# Patient Record
Sex: Male | Born: 1985 | Race: Black or African American | Hispanic: No | Marital: Single | Smoking: Current every day smoker
Health system: Southern US, Community
[De-identification: ages and names within clinical notes are randomized; demographics above are authoritative.]

## PROBLEM LIST (undated history)

## (undated) DIAGNOSIS — I10 Essential (primary) hypertension: Secondary | ICD-10-CM

## (undated) DIAGNOSIS — E119 Type 2 diabetes mellitus without complications: Secondary | ICD-10-CM

## (undated) HISTORY — PX: APPENDECTOMY: SHX54

## (undated) HISTORY — PX: TONSILLECTOMY: SUR1361

## (undated) HISTORY — PX: ADENOIDECTOMY: SUR15

---

## 2012-05-21 ENCOUNTER — Emergency Department (HOSPITAL_COMMUNITY)
Admission: EM | Admit: 2012-05-21 | Discharge: 2012-05-21 | Disposition: A | Discharge status: Home / Self Care | Payer: Self-pay | Attending: Emergency Medicine | Admitting: Emergency Medicine

## 2012-05-21 ENCOUNTER — Emergency Department (HOSPITAL_COMMUNITY): Payer: Self-pay

## 2012-05-21 ENCOUNTER — Encounter (HOSPITAL_COMMUNITY): Payer: Self-pay | Admitting: Emergency Medicine

## 2012-05-21 DIAGNOSIS — X58XXXA Exposure to other specified factors, initial encounter: Secondary | ICD-10-CM | POA: Insufficient documentation

## 2012-05-21 DIAGNOSIS — S62609A Fracture of unspecified phalanx of unspecified finger, initial encounter for closed fracture: Secondary | ICD-10-CM | POA: Insufficient documentation

## 2012-05-21 DIAGNOSIS — Y939 Activity, unspecified: Secondary | ICD-10-CM | POA: Insufficient documentation

## 2012-05-21 DIAGNOSIS — Y929 Unspecified place or not applicable: Secondary | ICD-10-CM | POA: Insufficient documentation

## 2012-05-21 MED ORDER — HYDROCODONE-ACETAMINOPHEN 5-325 MG PO TABS: 1.0000 | ORAL_TABLET | Freq: Four times a day (QID) | ORAL | Status: AC | PRN

## 2012-05-21 MED ORDER — IBUPROFEN 800 MG PO TABS
800.0000 mg | ORAL_TABLET | Freq: Once | ORAL | Status: AC
  Administered 2012-05-21: 800 mg via ORAL
  Filled 2012-05-21: qty 1

## 2012-05-21 MED ORDER — HYDROCODONE-ACETAMINOPHEN 5-325 MG PO TABS
1.0000 | ORAL_TABLET | Freq: Once | ORAL | Status: AC
  Administered 2012-05-21: 1 via ORAL
  Filled 2012-05-21: qty 1
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
## 2012-05-22 NOTE — ED Provider Notes (Signed)
Medical screening examination/treatment/procedure(s) were performed by non-physician practitioner and as supervising physician I was immediately available for consultation/collaboration.  Shelda Jakes, MD 05/22/12 [phone]

## 2013-03-12 ENCOUNTER — Emergency Department (HOSPITAL_COMMUNITY): Payer: Self-pay

## 2013-03-12 ENCOUNTER — Encounter (HOSPITAL_COMMUNITY): Payer: Self-pay | Admitting: Emergency Medicine

## 2013-03-12 ENCOUNTER — Emergency Department (HOSPITAL_COMMUNITY)
Admission: EM | Admit: 2013-03-12 | Discharge: 2013-03-12 | Disposition: A | Discharge status: Home / Self Care | Payer: Self-pay | Attending: Emergency Medicine | Admitting: Emergency Medicine

## 2013-03-12 DIAGNOSIS — R110 Nausea: Secondary | ICD-10-CM | POA: Insufficient documentation

## 2013-03-12 DIAGNOSIS — Z79899 Other long term (current) drug therapy: Secondary | ICD-10-CM | POA: Insufficient documentation

## 2013-03-12 DIAGNOSIS — R1013 Epigastric pain: Secondary | ICD-10-CM | POA: Insufficient documentation

## 2013-03-12 LAB — COMPREHENSIVE METABOLIC PANEL
ALT: 25 U/L (ref 0–53)
AST: 26 U/L (ref 0–37)
Alkaline Phosphatase: 95 U/L (ref 39–117)
CO2: 28 mEq/L (ref 19–32)
Calcium: 9.6 mg/dL (ref 8.4–10.5)
Chloride: 102 mEq/L (ref 96–112)
GFR calc Af Amer: >90 mL/min (ref >90)
GFR calc non Af Amer: >90 mL/min (ref >90)
Glucose, Bld: 91 mg/dL (ref 70–99)
Potassium: 4.2 mEq/L (ref 3.5–5.1)
Sodium: 138 mEq/L (ref 135–145)

## 2013-03-12 LAB — CBC WITH DIFFERENTIAL/PLATELET
Basophils Absolute: 0.1 10*3/uL (ref 0.0–0.1)
Lymphocytes Relative: 59 % — ABNORMAL HIGH (ref 12–46)
Lymphs Abs: 4.1 10*3/uL — ABNORMAL HIGH (ref 0.7–4.0)
MCV: 90.9 fL (ref 78.0–100.0)
Neutro Abs: 2 10*3/uL (ref 1.7–7.7)
Neutrophils Relative %: 29 % — ABNORMAL LOW (ref 43–77)
Platelets: 263 10*3/uL (ref 150–400)
RBC: 4.7 MIL/uL (ref 4.22–5.81)
RDW: 12.9 % (ref 11.5–15.5)
WBC: 6.9 10*3/uL (ref 4.0–10.5)

## 2013-03-12 LAB — URINALYSIS, ROUTINE W REFLEX MICROSCOPIC
Hgb urine dipstick: NEGATIVE
Leukocytes, UA: NEGATIVE
Nitrite: NEGATIVE
Protein, ur: NEGATIVE mg/dL
Specific Gravity, Urine: 1.02 (ref 1.005–1.030)
Urobilinogen, UA: 0.2 mg/dL (ref 0.0–1.0)

## 2013-03-12 MED ORDER — SODIUM CHLORIDE 0.9 % IV BOLUS (SEPSIS)
1000.0000 mL | Freq: Once | INTRAVENOUS | Status: AC
  Administered 2013-03-12: 1000 mL via INTRAVENOUS

## 2013-03-12 MED ORDER — IOHEXOL 300 MG/ML  SOLN
100.0000 mL | Freq: Once | INTRAMUSCULAR | Status: AC | PRN
  Administered 2013-03-12: 100 mL via INTRAVENOUS

## 2013-03-12 MED ORDER — FAMOTIDINE 20 MG PO TABS: 20.0000 mg | ORAL_TABLET | Freq: Two times a day (BID) | ORAL | Status: DC

## 2013-03-12 MED ORDER — ONDANSETRON HCL 4 MG/2ML IJ SOLN
4.0000 mg | Freq: Once | INTRAMUSCULAR | Status: AC
  Administered 2013-03-12: 4 mg via INTRAVENOUS
  Filled 2013-03-12: qty 2

## 2013-03-12 MED ORDER — SODIUM CHLORIDE 0.9 % IV SOLN: INTRAVENOUS | Status: DC

## 2013-03-12 MED ORDER — IOHEXOL 300 MG/ML  SOLN
50.0000 mL | Freq: Once | INTRAMUSCULAR | Status: AC | PRN
  Administered 2013-03-12: 50 mL via ORAL

## 2013-03-12 MED ORDER — PANTOPRAZOLE SODIUM 40 MG IV SOLR
40.0000 mg | Freq: Once | INTRAVENOUS | Status: AC
  Administered 2013-03-12: 40 mg via INTRAVENOUS
  Filled 2013-03-12: qty 40

## 2013-03-12 NOTE — ED Notes (Signed)
Patient reports using superglue daily to fix broken tooth x2 weeks.

## 2013-03-12 NOTE — ED Provider Notes (Signed)
CSN: [id number]     Arrival date & time 03/12/13  9528 History  This chart was scribed for Shelda Jakes, MD,  by Ashley Jacobs, ED Scribe. The patient was seen in room APA07/APA07 and the patient's care was started at 7:43.  irst MD Initiated Contact with Patient 03/12/13 [phone]     Chief Complaint  Patient presents with  . Abdominal Pain   (Consider location/radiation/quality/duration/timing/severity/associated sxs/prior Treatment) Patient is a 27 y.o. male presenting with abdominal pain.  Abdominal Pain Associated symptoms: nausea   Associated symptoms: no chills, no cough, no diarrhea, no dysuria, no fever, no shortness of breath and no vomiting    HPI Comments: Brandon Esparza is a 27 y.o. male who presents to the Emergency Department complaining of intermittent, sharp, epigastric abdominal pain with onset of 2 weeks PTA. Pt explains the pain as 0/10 presently and 10/10 at worse. He mentions experiencing pain while eating. He states his last episode the pain lasted for two hours and the longest episode being 6-7 hours.  Yesterday he reports having a "hungry feeling" and the pain occurred spontaneously.  PT states associated symptoms of black stool and nausea, . He denies back pain and vomiting. Pt denies prior similar episode.  History reviewed. No pertinent past medical history. Past Surgical History  Procedure Laterality Date  . Tonsillectomy    . Appendectomy    . Adenoidectomy     No family history on file. History  Substance Use Topics  . Smoking status: Never Smoker   . Smokeless tobacco: Never Used  . Alcohol Use: No    Review of Systems  Constitutional: Negative for fever and chills.  HENT: Negative for congestion and rhinorrhea.   Eyes: Negative for visual disturbance.  Respiratory: Negative for cough and shortness of breath.   Cardiovascular: Negative for leg swelling.  Gastrointestinal: Positive for nausea and abdominal pain. Negative for vomiting and  diarrhea.  Genitourinary: Negative for dysuria and flank pain.  Musculoskeletal: Negative for back pain.  Skin: Negative for rash.  Neurological: Negative for headaches.  Hematological: Does not bruise/bleed easily.  Psychiatric/Behavioral: Negative for confusion.  All other systems reviewed and are negative.    Allergies  Review of patient's allergies indicates no known allergies.  Home Medications   Current Outpatient Rx  Name  Route  Sig  Dispense  Refill  . famotidine (PEPCID) 20 MG tablet   Oral   Take 1 tablet (20 mg total) by mouth 2 (two) times daily.   30 tablet   2    BP 131/107  Pulse 60  Temp(Src) 97.7 F (36.5 C) (Oral)  Resp 14  Ht 6\' 3"  (1.905 m)  Wt 300 lb (136.079 kg)  BMI 37.50 kg/m2  SpO2 98% Physical Exam  Constitutional: He is oriented to person, place, and time. He appears well-developed and well-nourished. No distress.  HENT:  Head: Normocephalic and atraumatic.  Mouth/Throat: Oropharynx is clear and moist.  Eyes: Conjunctivae and EOM are normal. Pupils are equal, round, and reactive to light.  Neck: Normal range of motion.  Cardiovascular: Normal rate, regular rhythm and normal heart sounds.  Exam reveals no gallop and no friction rub.   No murmur heard. Pulmonary/Chest: Effort normal and breath sounds normal. No respiratory distress. He has no wheezes. He has no rales.  Abdominal: Soft. There is no tenderness. There is no rebound and no guarding.  Musculoskeletal: Normal range of motion. He exhibits no edema and no tenderness.  Neurological: He is alert and  oriented to person, place, and time. No cranial nerve deficit. Coordination normal.  Skin: Skin is warm. No rash noted. He is not diaphoretic.  Psychiatric: He has a normal mood and affect. His behavior is normal.    ED Course  Procedures (including critical care time) DIAGNOSTIC STUDIES: Oxygen Saturation is 98% on room air, normal by my interpretation.    COORDINATION OF  CARE: 7:50 AM Discussed course of care with pt . Pt understands and agrees.    Labs Review Labs Reviewed  CBC WITH DIFFERENTIAL - Abnormal; Notable for the following:    Neutrophils Relative % 29 (*)    Lymphocytes Relative 59 (*)    Lymphs Abs 4.1 (*)    All other components within normal limits  URINALYSIS, ROUTINE W REFLEX MICROSCOPIC  COMPREHENSIVE METABOLIC PANEL  LIPASE, BLOOD  OCCULT BLOOD, POC DEVICE   Results for orders placed during the hospital encounter of 03/12/13  URINALYSIS, ROUTINE W REFLEX MICROSCOPIC      Result Value Range   Color, Urine YELLOW  YELLOW   APPearance CLEAR  CLEAR   Specific Gravity, Urine 1.020  1.005 - 1.030   pH 6.5  5.0 - 8.0   Glucose, UA NEGATIVE  NEGATIVE mg/dL   Hgb urine dipstick NEGATIVE  NEGATIVE   Bilirubin Urine NEGATIVE  NEGATIVE   Ketones, ur NEGATIVE  NEGATIVE mg/dL   Protein, ur NEGATIVE  NEGATIVE mg/dL   Urobilinogen, UA 0.2  0.0 - 1.0 mg/dL   Nitrite NEGATIVE  NEGATIVE   Leukocytes, UA NEGATIVE  NEGATIVE  CBC WITH DIFFERENTIAL      Result Value Range   WBC 6.9  4.0 - 10.5 K/uL   RBC 4.70  4.22 - 5.81 MIL/uL   Hemoglobin 13.9  13.0 - 17.0 g/dL   HCT 13.2  44.0 - 10.2 %   MCV 90.9  78.0 - 100.0 fL   MCH 29.6  26.0 - 34.0 pg   MCHC 32.6  30.0 - 36.0 g/dL   RDW 72.5  36.6 - 44.0 %   Platelets 263  150 - 400 K/uL   Neutrophils Relative % 29 (*) 43 - 77 %   Neutro Abs 2.0  1.7 - 7.7 K/uL   Lymphocytes Relative 59 (*) 12 - 46 %   Lymphs Abs 4.1 (*) 0.7 - 4.0 K/uL   Monocytes Relative 7  3 - 12 %   Monocytes Absolute 0.5  0.1 - 1.0 K/uL   Eosinophils Relative 4  0 - 5 %   Eosinophils Absolute 0.3  0.0 - 0.7 K/uL   Basophils Relative 1  0 - 1 %   Basophils Absolute 0.1  0.0 - 0.1 K/uL  COMPREHENSIVE METABOLIC PANEL      Result Value Range   Sodium 138  135 - 145 mEq/L   Potassium 4.2  3.5 - 5.1 mEq/L   Chloride 102  96 - 112 mEq/L   CO2 28  19 - 32 mEq/L   Glucose, Bld 91  70 - 99 mg/dL   BUN 6  6 - 23 mg/dL    Creatinine, Ser 3.47  0.50 - 1.35 mg/dL   Calcium 9.6  8.4 - 42.5 mg/dL   Total Protein 8.0  6.0 - 8.3 g/dL   Albumin 3.7  3.5 - 5.2 g/dL   AST 26  0 - 37 U/L   ALT 25  0 - 53 U/L   Alkaline Phosphatase 95  39 - 117 U/L   Total Bilirubin 0.3  0.3 - 1.2 mg/dL   GFR calc non Af Amer >90  >90 mL/min   GFR calc Af Amer >90  >90 mL/min  LIPASE, BLOOD      Result Value Range   Lipase 45  11 - 59 U/L  OCCULT BLOOD, POC DEVICE      Result Value Range   Fecal Occult Bld NEGATIVE  NEGATIVE    Imaging Review Dg Chest 2 View  03/12/2013   CLINICAL DATA:  Abdominal pain for 2 weeks, smoking history  EXAM: CHEST  2 VIEW  COMPARISON:  None.  FINDINGS: The lungs are slightly hyperaerated. No focal infiltrate or effusion is seen. Mediastinal contours are normal. The heart is within normal limits in size.  IMPRESSION: No active cardiopulmonary disease.   Electronically Signed   By: Dwyane Dee M.D.   On: 03/12/2013 08:54   Ct Abdomen Pelvis W Contrast  03/12/2013   CLINICAL DATA:  Intermittent sharp epigastric abdominal pain onset 2 weeks ago, worsening, pain with eating, associated nausea and black stool, no back pain or vomiting ; past history appendectomy  EXAM: CT ABDOMEN AND PELVIS WITH CONTRAST  TECHNIQUE: Multidetector CT imaging of the abdomen and pelvis was performed using the standard protocol following bolus administration of intravenous contrast. Sagittal and coronal MPR images reconstructed from axial data set.  CONTRAST:  50mL OMNIPAQUE IOHEXOL 300 MG/ML SOLN, OMNIPAQUE IOHEXOL 300 MG/ML SOLN  COMPARISON:  None  FINDINGS: Lung bases clear.  Mild degradation of image quality secondary to body habitus.  Liver, spleen, pancreas, kidneys, and adrenal glands normal appearance.  Stomach normally distended without wall thickening or outlet obstruction.  Large and small bowel loops suboptimally opacified but otherwise normal appearance.  Normal appearing bladder in ureters.  Mildly enlarged inguinal  lymph nodes bilaterally largest right inguinal 19 mm short axis image 92.  Enlarged right external iliac lymph node 20 mm short axis image 84.  Numerous additional normal and upper normal sized bilateral inguinal lymph nodes.  No mass, free fluid, inflammatory process or hernia.  No acute osseous findings.  IMPRESSION: Right external iliac and right inguinal adenopathy with numerous additional normal and upper normal sized inguinal lymph nodes bilaterally, nonspecific.  No definite acute intra-abdominal or intrapelvic abnormalities identified.   Electronically Signed   By: Ulyses Southward M.D.   On: 03/12/2013 09:25    EKG Interpretation   None       MDM   1. Epigastric abdominal pain    Workup for the epigastric abdominal pain without specific findings. No evidence of gallbladder problems no evidence of pancreatitis. No evidence of a lower lobe pneumonia. Patient's Hemoccult stool is negative so no evidence of GI bleeding. Patient did in retrospect Stark Klein did take some Pepto-Bismol so the black stools are probably related to that. Patient will be treated for the possibility of peptic ulcer disease he was started on Pepcid for the next 2 weeks given GI referral and given a resource guide to help find a primary care Dr. Patient will return for new or worse symptoms.   I personally performed the services described in this documentation, which was scribed in my presence. The recorded information has been reviewed and is accurate.      Shelda Jakes, MD 03/12/13 [phone]

## 2013-03-12 NOTE — ED Notes (Addendum)
Patient c/o intermittent lower abd pain x2 weeks. Denies any nausea, vomiting, fevers, or diarrhea. Patient also denies any urinary symptoms. Patient reports taking Pepto Bismol with no relief. Patient reports last BM yesterday. Per patient dark tarry stool. Patient reports pain with eating starting yesterday.

## 2014-10-02 IMAGING — CR DG CHEST 2V
2 series · 2 of 2 positions shown · non-contrast
Comparison: None.

CLINICAL DATA: Abdominal pain for 2 weeks, smoking history

EXAM:
CHEST  2 VIEW

[view not recorded (1 of 2)]
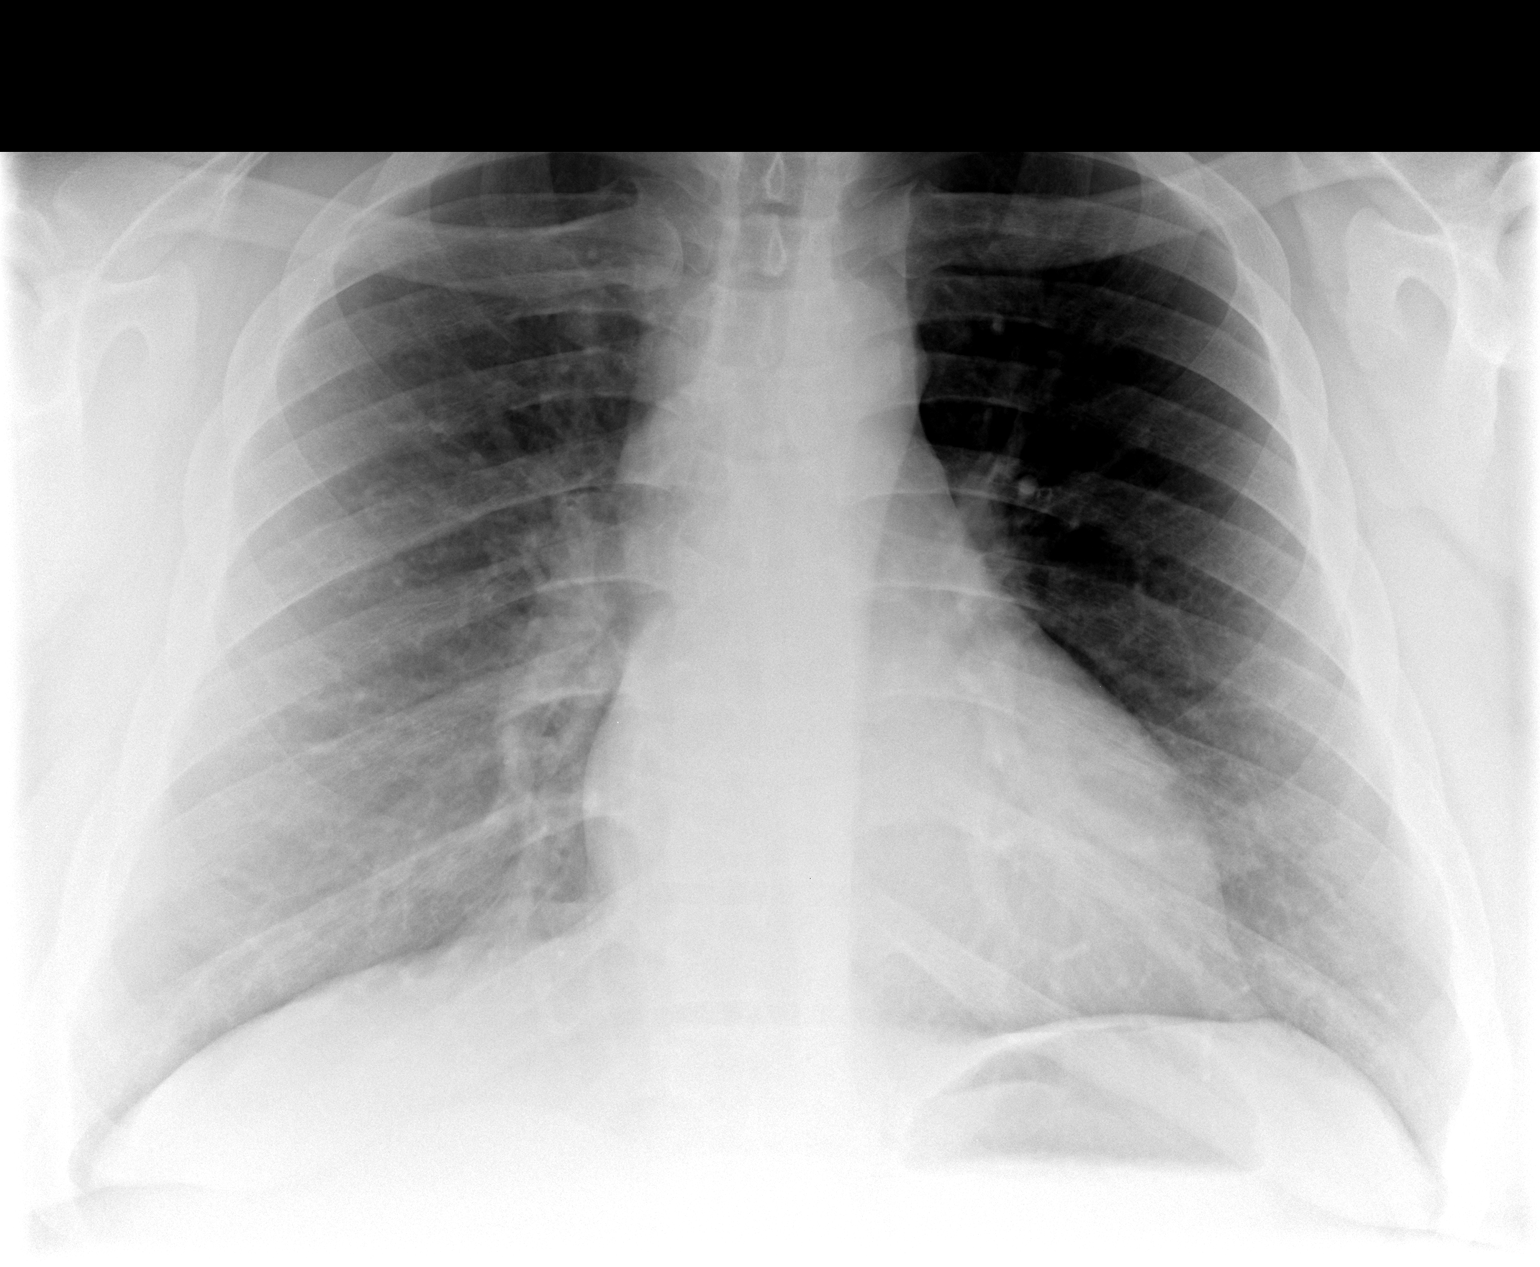

[view not recorded (2 of 2)]
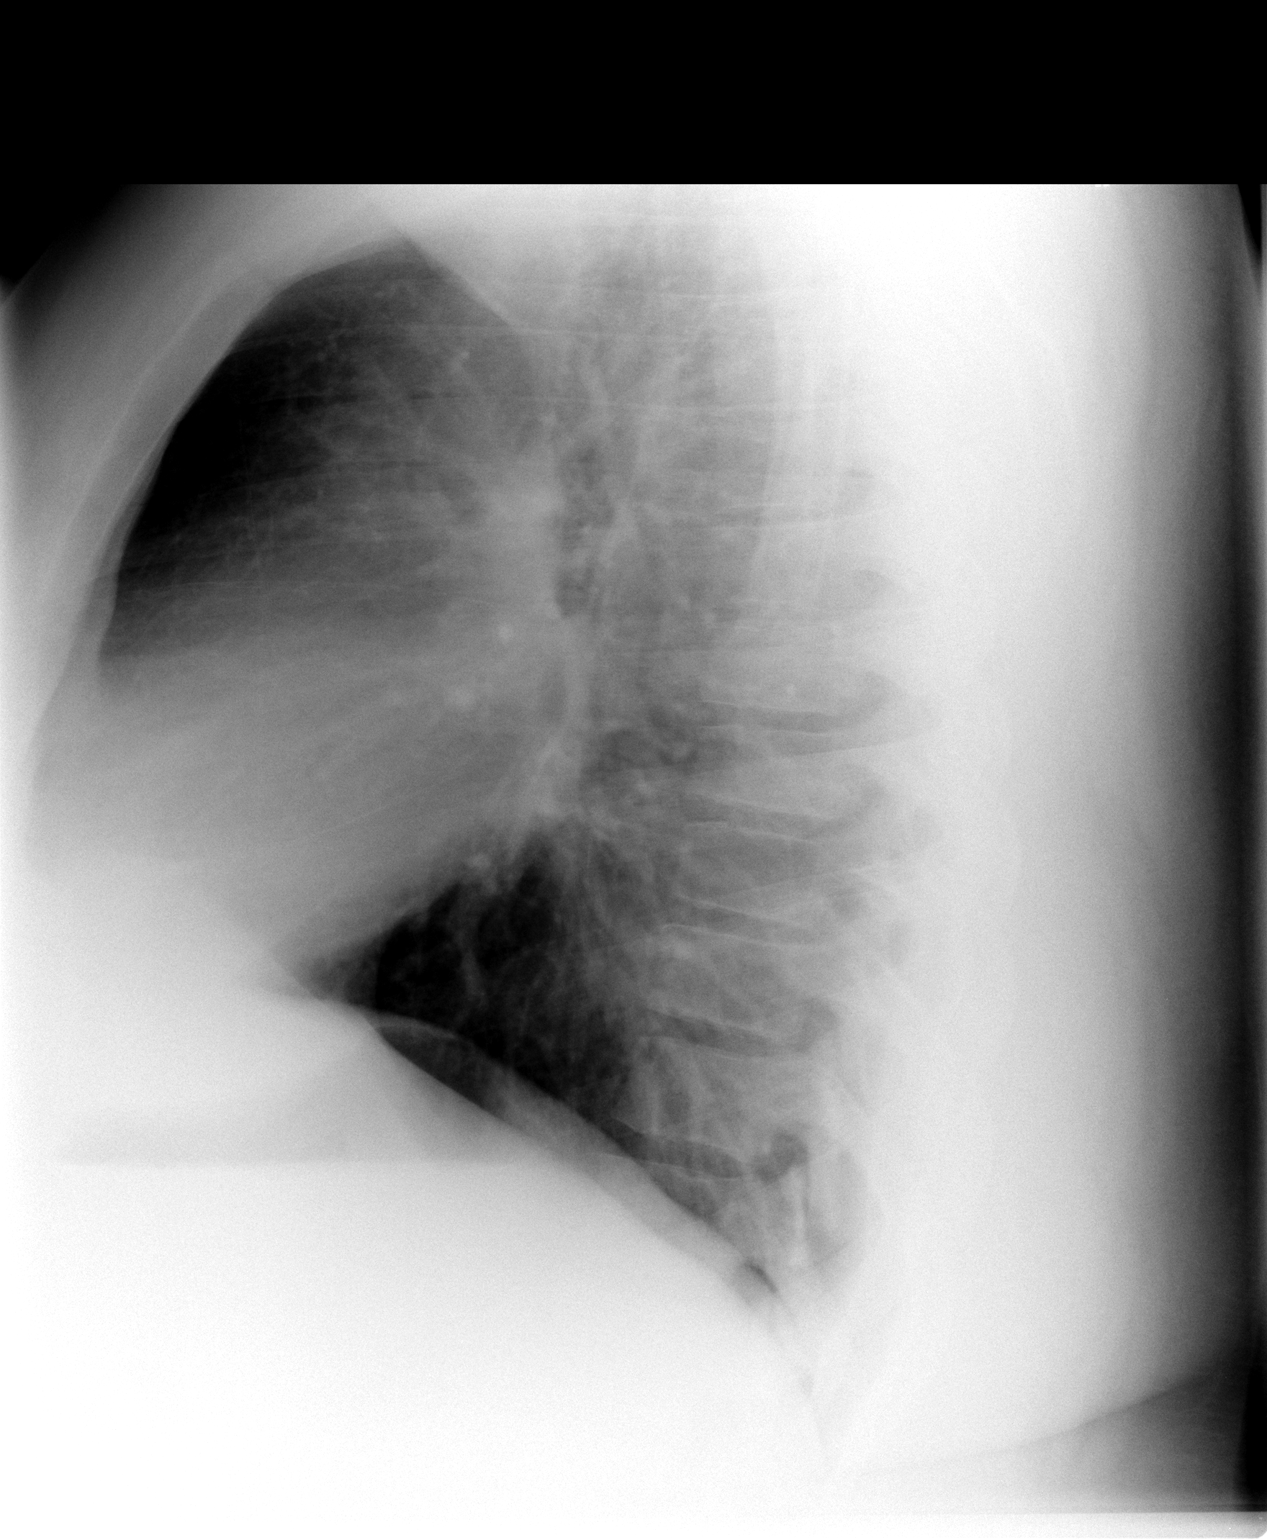

[2 of 2 positions shown; findings below may reference images not displayed]

FINDINGS: The lungs are slightly hyperaerated. No focal infiltrate or effusion
is seen. Mediastinal contours are normal. The heart is within normal
limits in size.
IMPRESSION: No active cardiopulmonary disease.

## 2014-10-02 IMAGING — CT CT ABD-PELV W/ CM
2 of 3 series · 17 of 46 positions shown, 19 images · IV contrast (Omnipaque 300)
Comparison: None

CLINICAL DATA: Intermittent sharp epigastric abdominal pain onset 2
weeks ago, worsening, pain with eating, associated nausea and black
stool, no back pain or vomiting ; past history appendectomy

EXAM:
CT ABDOMEN AND PELVIS WITH CONTRAST
TECHNIQUE: Multidetector CT imaging of the abdomen and pelvis was performed
using the standard protocol following bolus administration of
intravenous contrast. Sagittal and coronal MPR images reconstructed
from axial data set.
CONTRAST:  50mL OMNIPAQUE IOHEXOL 300 MG/ML SOLN, 100mL OMNIPAQUE
IOHEXOL 300 MG/ML SOLN

[Series 2: abd_pel_with 5.0 b40f · axial · 0.91mm/px · z∈[-487,-52]mm · 14 of 101 slices shown, 16 images]
[im 7/101  soft-tissue]
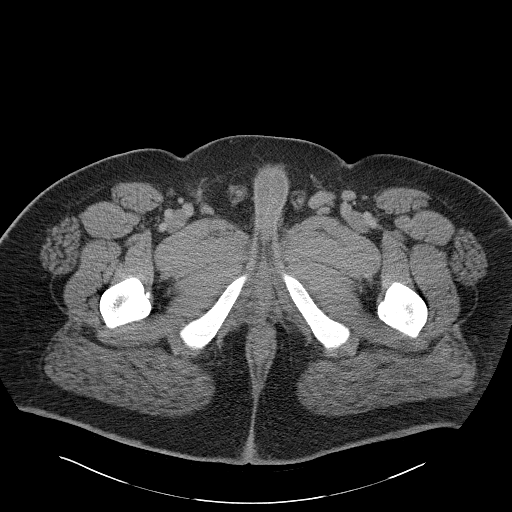
[im 7/101  bone]
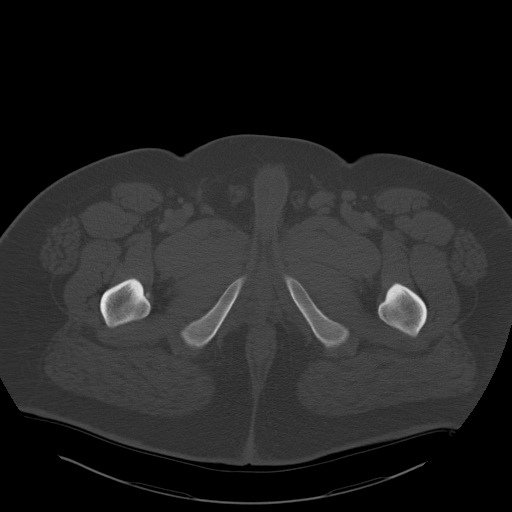
[im 13/101  soft-tissue]
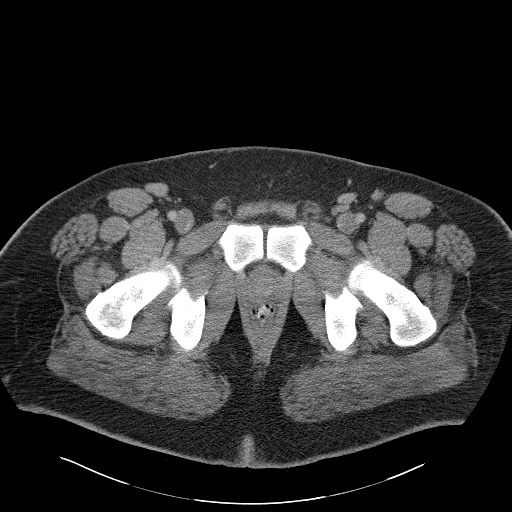
[im 20/101  soft-tissue]
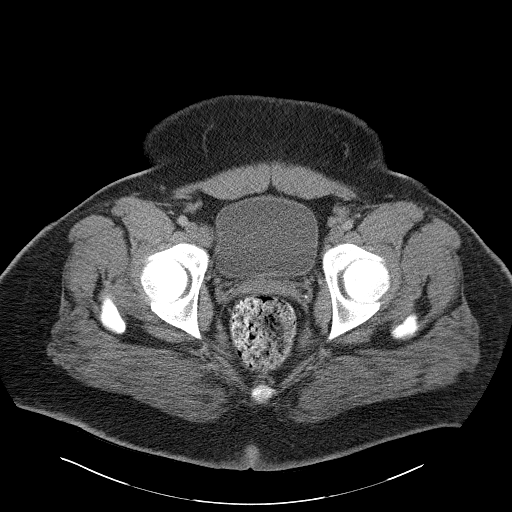
[im 26/101  soft-tissue]
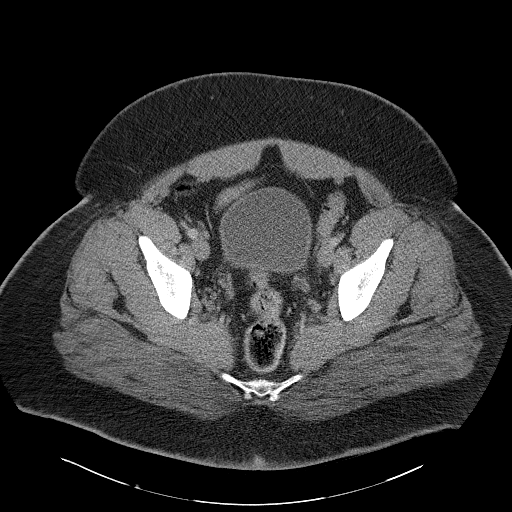
[im 33/101  soft-tissue]
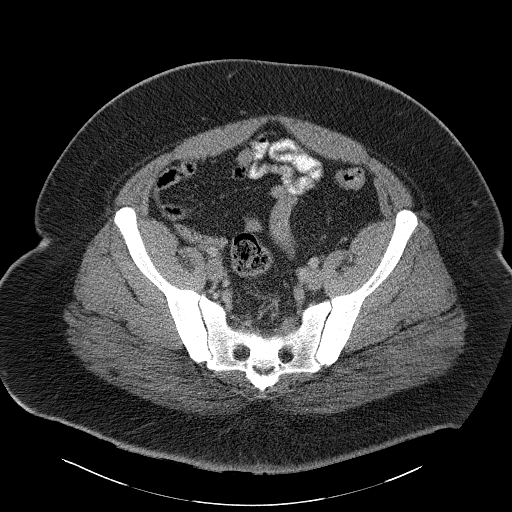
[im 39/101  soft-tissue]
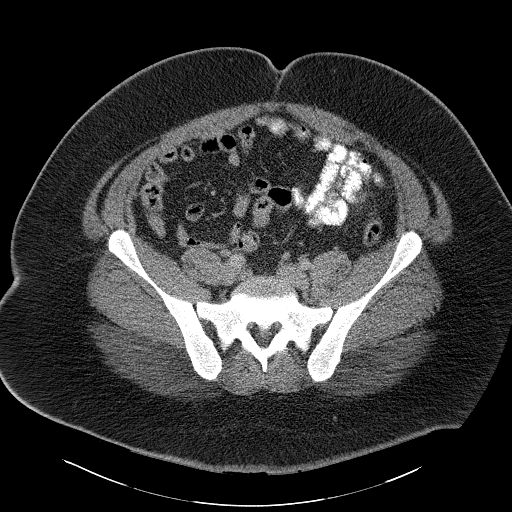
[im 46/101  soft-tissue]
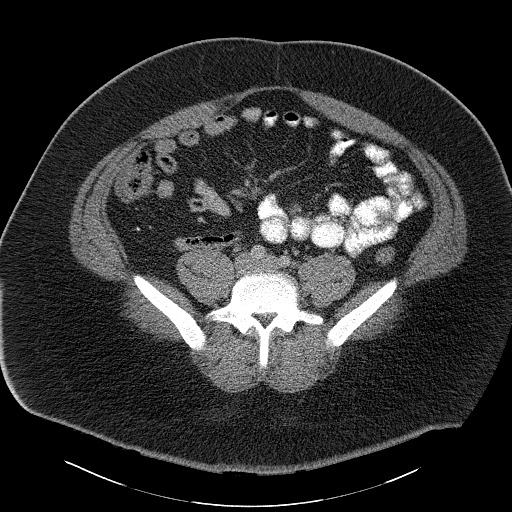
[im 55/101  soft-tissue]
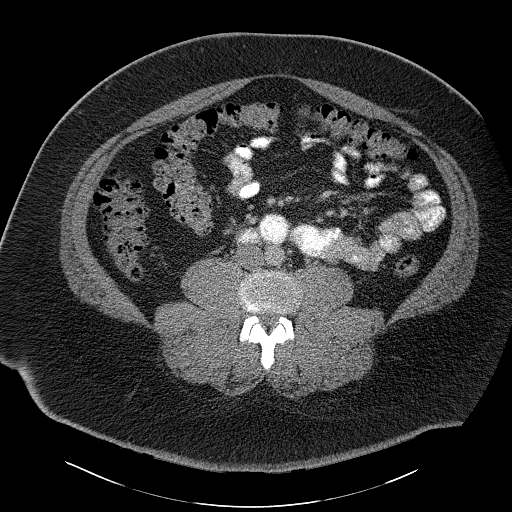
[im 62/101  soft-tissue]
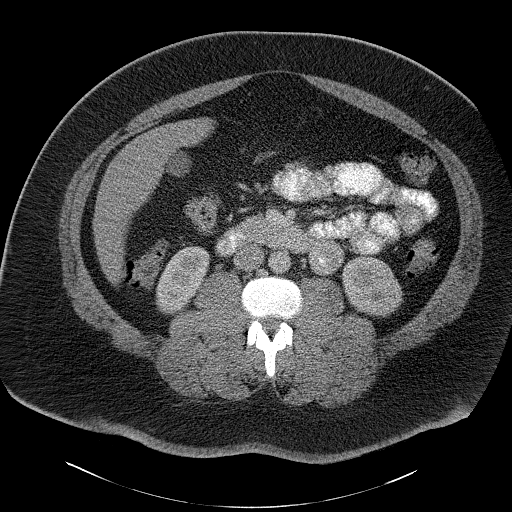
[im 62/101  bone]
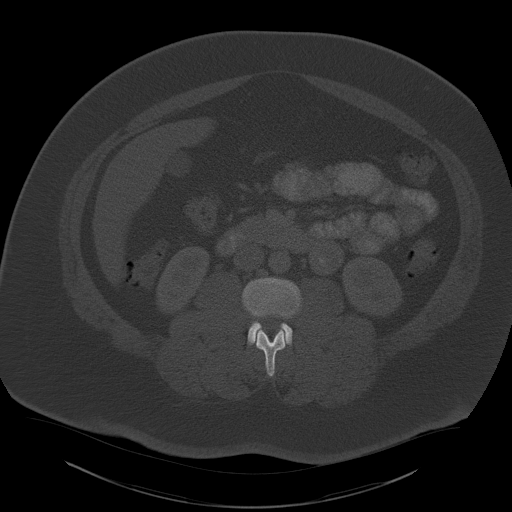
[im 68/101  soft-tissue]
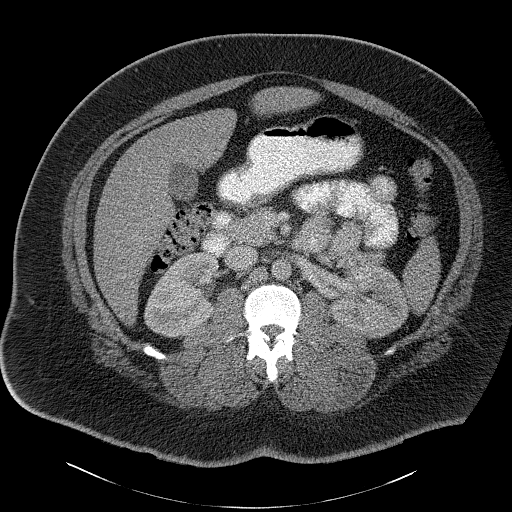
[im 75/101  soft-tissue]
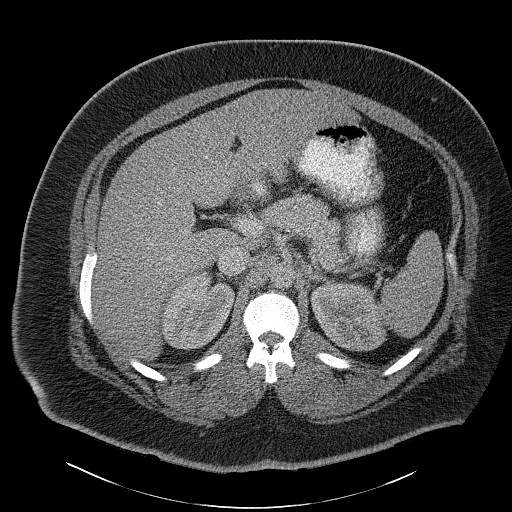
[im 81/101  soft-tissue]
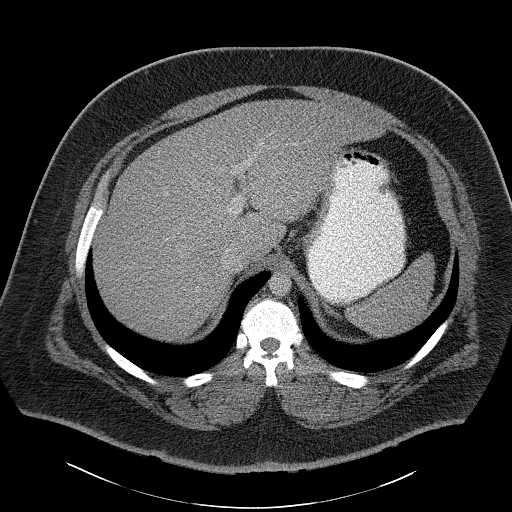
[im 88/101  soft-tissue]
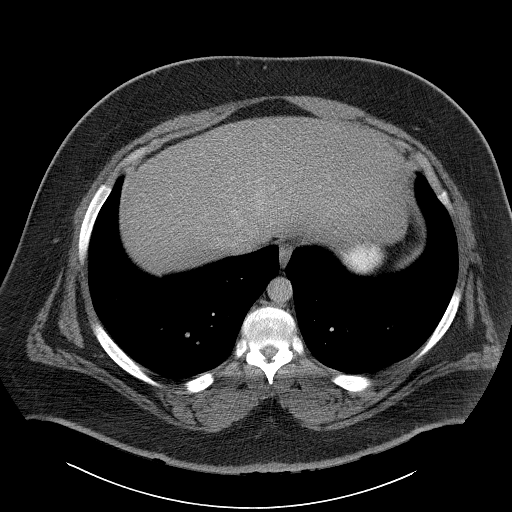
[im 94/101  soft-tissue]
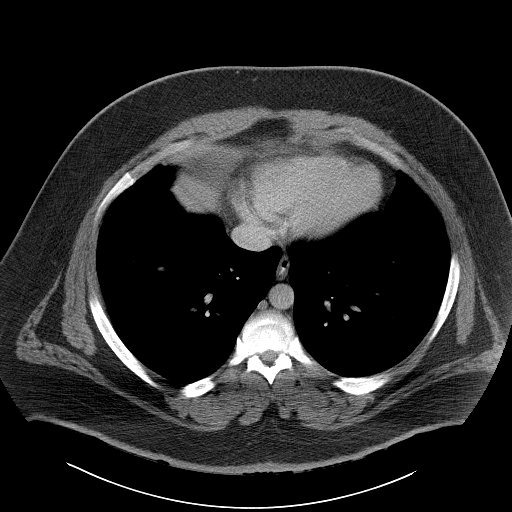

[Series 4: abd_pel_with 3.0 spo cor · coronal · 0.94mm/px · 3 of 116 slices shown]
[im 39/116  soft-tissue]
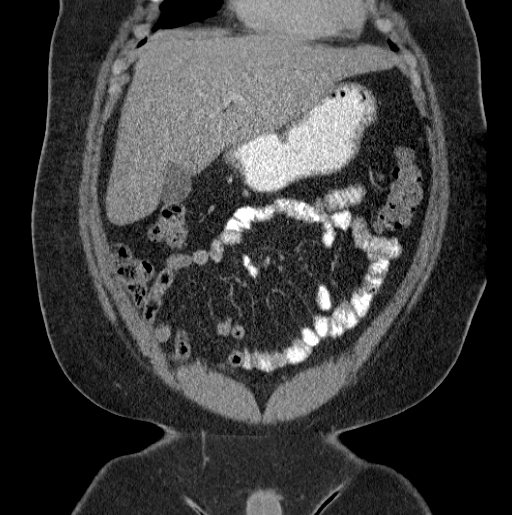
[im 52/116  soft-tissue]
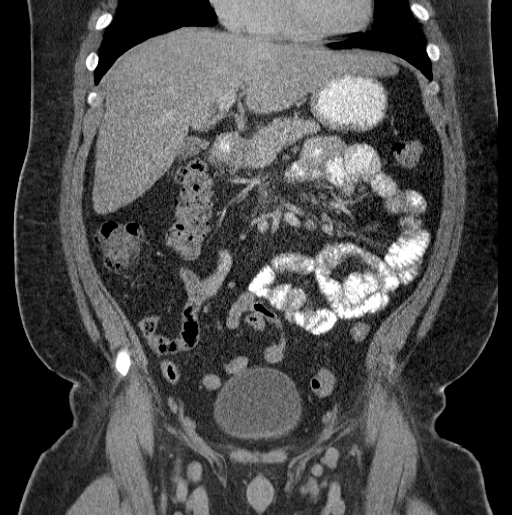
[im 64/116  soft-tissue]
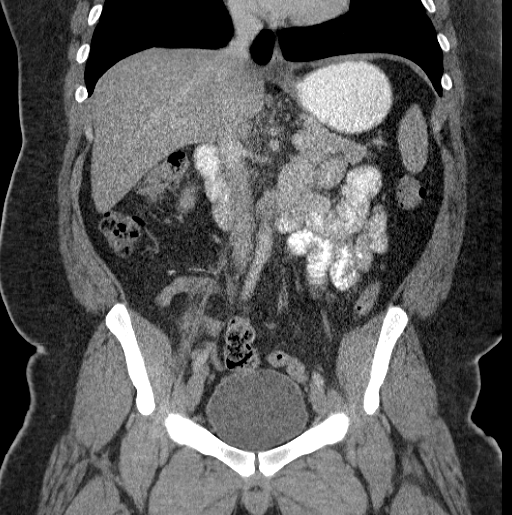

[17 of 46 positions shown; findings below may reference images not displayed]

FINDINGS: Lung bases clear.

Mild degradation of image quality secondary to body habitus.

Liver, spleen, pancreas, kidneys, and adrenal glands normal
appearance.

Stomach normally distended without wall thickening or outlet
obstruction.

Large and small bowel loops suboptimally opacified but otherwise
normal appearance.

Normal appearing bladder in ureters.

Mildly enlarged inguinal lymph nodes bilaterally largest right
inguinal 19 mm short axis image 92.

Enlarged right external iliac lymph node 20 mm short axis image 84.

Numerous additional normal and upper normal sized bilateral inguinal
lymph nodes.

No mass, free fluid, inflammatory process or hernia.

No acute osseous findings.
IMPRESSION: Right external iliac and right inguinal adenopathy with numerous
additional normal and upper normal sized inguinal lymph nodes
bilaterally, nonspecific.

No definite acute intra-abdominal or intrapelvic abnormalities
identified.

## 2014-11-21 ENCOUNTER — Emergency Department (HOSPITAL_COMMUNITY)
Admission: EM | Admit: 2014-11-21 | Discharge: 2014-11-21 | Disposition: A | Discharge status: Home / Self Care | Payer: Self-pay | Attending: Emergency Medicine | Admitting: Emergency Medicine

## 2014-11-21 ENCOUNTER — Emergency Department (HOSPITAL_COMMUNITY): Payer: Self-pay

## 2014-11-21 ENCOUNTER — Encounter (HOSPITAL_COMMUNITY): Payer: Self-pay | Admitting: Emergency Medicine

## 2014-11-21 DIAGNOSIS — E119 Type 2 diabetes mellitus without complications: Secondary | ICD-10-CM | POA: Insufficient documentation

## 2014-11-21 DIAGNOSIS — Y999 Unspecified external cause status: Secondary | ICD-10-CM | POA: Insufficient documentation

## 2014-11-21 DIAGNOSIS — X58XXXA Exposure to other specified factors, initial encounter: Secondary | ICD-10-CM | POA: Insufficient documentation

## 2014-11-21 DIAGNOSIS — Y939 Activity, unspecified: Secondary | ICD-10-CM | POA: Insufficient documentation

## 2014-11-21 DIAGNOSIS — Z720 Tobacco use: Secondary | ICD-10-CM | POA: Insufficient documentation

## 2014-11-21 DIAGNOSIS — Z79899 Other long term (current) drug therapy: Secondary | ICD-10-CM | POA: Insufficient documentation

## 2014-11-21 DIAGNOSIS — Y929 Unspecified place or not applicable: Secondary | ICD-10-CM | POA: Insufficient documentation

## 2014-11-21 DIAGNOSIS — I10 Essential (primary) hypertension: Secondary | ICD-10-CM | POA: Insufficient documentation

## 2014-11-21 DIAGNOSIS — B353 Tinea pedis: Secondary | ICD-10-CM | POA: Insufficient documentation

## 2014-11-21 HISTORY — DX: Type 2 diabetes mellitus without complications: E11.9

## 2014-11-21 HISTORY — DX: Essential (primary) hypertension: I10

## 2014-11-21 LAB — CBG MONITORING, ED: Glucose-Capillary: 91 mg/dL (ref 65–99)

## 2014-11-21 MED ORDER — CLOTRIMAZOLE 1 % EX CREA: TOPICAL_CREAM | CUTANEOUS | Status: DC

## 2014-11-21 NOTE — ED Provider Notes (Signed)
CSN: [id number]     Arrival date & time 11/21/14  1237 History  This chart was scribed for Brandon Rhine, MD by Lyndel Safe, ED Scribe. This patient was seen in room APA09/APA09 and the patient's care was started 12:51 PM.    Chief Complaint  Patient presents with  . Foot Injury    Patient is a 29 y.o. male presenting with foot injury. The history is provided by the patient and a significant other. No language interpreter was used.  Foot Injury Location:  Foot Injury: yes   Mechanism of injury comment:  Pt unsure Foot location:  L foot Pain details:    Radiates to:  Does not radiate   Severity:  Mild   Timing:  Intermittent   Progression:  Unchanged Chronicity:  New Dislocation: no   Foreign body present:  No foreign bodies Relieved by:  None tried Worsened by:  Nothing tried Ineffective treatments:  None tried Associated symptoms: no back pain, no fever and no numbness    HPI Comments: Brandon Esparza is a 29 y.o. male, with a PMhx of DM and HTN, who presents to the Emergency Department complaining of a laceration that has not healed to left foot in between his 2nd and 3rd toes with intermittent, mild pain. Pt reports he sustained the laceration over a month ago and is unaware of the mechanism of injury. His significant other reports he is diabetic and she is concerned about the laceration on the pt's foot after she noticed a black discharge oozing from the wound. He denies cough, fevers, vomiting, nausea, CP, back pain, abdominal pain, any other arthralgias or myalgias, or numbness in LE/feet.   Past Medical History  Diagnosis Date  . Diabetes mellitus without complication   . Hypertension    Past Surgical History  Procedure Laterality Date  . Tonsillectomy    . Appendectomy    . Adenoidectomy     History reviewed. No pertinent family history. History  Substance Use Topics  . Smoking status: Current Every Day Smoker -- 0.50 packs/day    Types: Cigarettes  .  Smokeless tobacco: Never Used  . Alcohol Use: No    Review of Systems  Constitutional: Negative for fever.  Respiratory: Negative for cough.   Cardiovascular: Negative for chest pain.  Gastrointestinal: Negative for nausea, vomiting and abdominal pain.  Musculoskeletal: Negative for myalgias, back pain and arthralgias.  Skin: Positive for wound.  Neurological: Negative for numbness.   Allergies  Review of patient's allergies indicates no known allergies.  Home Medications   Prior to Admission medications   Medication Sig Start Date End Date Taking? Authorizing Provider  famotidine (PEPCID) 20 MG tablet Take 1 tablet (20 mg total) by mouth 2 (two) times daily. 03/12/13   Vanetta Mulders, MD   BP 162/105 mmHg  Pulse 80  Temp(Src) 98 F (36.7 C) (Oral)  Resp 18  Ht  (1.905 m)  Wt 350 lb (158.759 kg)  BMI 43.75 kg/m2  SpO2 100% Physical Exam  CONSTITUTIONAL: Well developed/well nourished HEAD: Normocephalic/atraumatic EYES: EOMI ENMT: Mucous membranes moist NECK: supple no meningeal signs SPINE/BACK:entire spine nontender CV: S1/S2 noted, no murmurs/rubs/gallops noted LUNGS: Lungs are clear to auscultation bilaterally, no apparent distress ABDOMEN: soft, nontender, no rebound or guarding, bowel sounds noted throughout abdomen NEURO: Pt is awake/alert/appropriate, moves all extremitiesx4.  No facial droop.   EXTREMITIES: pulses normal/equal, full ROM; no puncture wounds; no erythema; no tenderness to left foot; small area of maceration in between toes; no  drainage noted  SKIN: warm, color normal PSYCH: no abnormalities of mood noted, alert and oriented to situation  ED Course  Procedures  DIAGNOSTIC STUDIES: Oxygen Saturation is 100% on RA, normal by my interpretation.    COORDINATION OF CARE: 12:56 PM Discussed treatment plan which includes to order diagnostic labs and imaging with pt. Pt acknowledges and agrees to plan.    Suspect tinea pedis No signs of  diabetic foot or deep space infection/osteomyelitis Stable for d/c home  Labs Review Labs Reviewed  CBG MONITORING, ED    Imaging Review Dg Foot Complete Left  11/21/2014   CLINICAL DATA:  Left foot infection  EXAM: LEFT FOOT - COMPLETE 3+ VIEW  COMPARISON:  None.  FINDINGS: No fracture or dislocation is seen.  The joint spaces are preserved.  Visualized soft tissues are grossly unremarkable.  No radiopaque foreign body is seen.  No radiographic findings to suggest osteomyelitis.  IMPRESSION: No radiographic findings to suggest osteomyelitis.   Electronically Signed   By: Charline BillsSriyesh  Krishnan M.D.   On: 11/21/2014 13:21     MDM   Final diagnoses:  Tinea pedis of left foot    Nursing notes including past medical history and social history reviewed and considered in documentation xrays/imaging reviewed by myself and considered during evaluation Labs/vital reviewed myself and considered during evaluation    I personally performed the services described in this documentation, which was scribed in my presence. The recorded information has been reviewed and is accurate.      Brandon Rhineonald Adanely Reynoso, MD 11/21/14 [phone]

## 2014-11-21 NOTE — Discharge Instructions (Signed)
Athlete's Foot  Athlete's foot is a skin infection caused by a fungus. Athlete's foot is often seen between or under the toes. It can also be seen on the bottom of the foot. Athlete's foot can spread to other people by sharing towels or shower stalls. HOME CARE  Only take medicines as told by your doctor. Do not use steroid creams.  Wash your feet daily. Dry your feet well, especially between the toes.  Change your socks every day. Wear cotton or wool socks.  Change your socks 2 to 3 times a day in hot weather.  Wear sandals or canvas tennis shoes with good airflow.  If you have blisters, soak your feet in a solution as told by your doctor. Do this for 20 to 30 minutes, 2 times a day. Dry your feet well after you soak them.  Do not share towels.  Wear sandals when you use shared locker rooms or showers. GET HELP RIGHT AWAY IF:   You have a fever.  Your foot is puffy (swollen), sore, warm, or red.  You are not getting better after 7 days of treatment.  You still have athlete's foot after 30 days.  You have problems caused by your medicine. MAKE SURE YOU:   Understand these instructions.  Will watch your condition.  Will get help right away if you are not doing well or get worse. Document Released: 10/10/2007 Document Revised: 07/16/2011 Document Reviewed: 02/09/2011 [employer] Patient Information 2015 [employer]. This information is not intended to replace advice given to you by your health care provider. Make sure you discuss any questions you have with your health care provider.  

## 2014-11-21 NOTE — ED Notes (Signed)
Pt states that he has had a cut between his 2nd and 3rd toes on left foot.  States that it has been there for a while and is not getting better.

## 2015-03-09 ENCOUNTER — Emergency Department (HOSPITAL_COMMUNITY)
Admission: EM | Admit: 2015-03-09 | Discharge: 2015-03-09 | Disposition: A | Discharge status: Home / Self Care | Payer: Self-pay | Attending: Emergency Medicine | Admitting: Emergency Medicine

## 2015-03-09 ENCOUNTER — Encounter (HOSPITAL_COMMUNITY): Payer: Self-pay

## 2015-03-09 DIAGNOSIS — Z79899 Other long term (current) drug therapy: Secondary | ICD-10-CM | POA: Insufficient documentation

## 2015-03-09 DIAGNOSIS — I10 Essential (primary) hypertension: Secondary | ICD-10-CM | POA: Insufficient documentation

## 2015-03-09 DIAGNOSIS — E119 Type 2 diabetes mellitus without complications: Secondary | ICD-10-CM | POA: Insufficient documentation

## 2015-03-09 DIAGNOSIS — B353 Tinea pedis: Secondary | ICD-10-CM | POA: Insufficient documentation

## 2015-03-09 DIAGNOSIS — L03032 Cellulitis of left toe: Secondary | ICD-10-CM | POA: Insufficient documentation

## 2015-03-09 DIAGNOSIS — Z720 Tobacco use: Secondary | ICD-10-CM | POA: Insufficient documentation

## 2015-03-09 LAB — CBG MONITORING, ED: GLUCOSE-CAPILLARY: 81 mg/dL (ref 65–99)

## 2015-03-09 MED ORDER — TRAMADOL HCL 50 MG PO TABS: 50.0000 mg | ORAL_TABLET | Freq: Four times a day (QID) | ORAL | Status: DC | PRN

## 2015-03-09 MED ORDER — CLOTRIMAZOLE 1 % EX CREA: TOPICAL_CREAM | CUTANEOUS | Status: DC

## 2015-03-09 MED ORDER — SULFAMETHOXAZOLE-TRIMETHOPRIM 800-160 MG PO TABS: 1.0000 | ORAL_TABLET | Freq: Two times a day (BID) | ORAL | Status: AC

## 2015-03-09 NOTE — ED Notes (Signed)
Pt states understanding of care given and follow up instructions 

## 2015-03-09 NOTE — ED Provider Notes (Signed)
CSN: [id number]     Arrival date & time 03/09/15  2306 History  By signing my name below, I, Tanda Rockers, attest that this documentation has been prepared under the direction and in the presence of Gilda Crease, MD. Electronically Signed: Tanda Rockers, ED Scribe. 03/09/2015. 11:19 PM.  Chief Complaint  Patient presents with  . Toe Pain   The history is provided by the patient. No language interpreter was used.     HPI Comments: Brandon Esparza is a 29 y.o. male with hx DM who presents to the Emergency Department complaining of gradual onset, constant, sharp, 8/10, 5th digit pain on left foot that began this morning. Pt states that he wore shoes that were too small for him for a couple days prior to the pain beginning. No recent injury that could have caused the pain. Denies weakness, numbness, tingling, or any other associated symptoms.    Past Medical History  Diagnosis Date  . Diabetes mellitus without complication (HCC)   . Hypertension    Past Surgical History  Procedure Laterality Date  . Tonsillectomy    . Appendectomy    . Adenoidectomy     No family history on file. Social History  Substance Use Topics  . Smoking status: Current Every Day Smoker -- 0.50 packs/day    Types: Cigarettes  . Smokeless tobacco: Never Used  . Alcohol Use: No    Review of Systems  Musculoskeletal: Positive for arthralgias.  Neurological: Negative for weakness and numbness.  Psychiatric/Behavioral: Agitation: 5th digit on left foot.  All other systems reviewed and are negative.  Allergies  Review of patient's allergies indicates no known allergies.  Home Medications   Prior to Admission medications   Medication Sig Start Date End Date Taking? Authorizing Provider  lisinopril-hydrochlorothiazide (PRINZIDE,ZESTORETIC) 20-12.5 MG per tablet Take 1 tablet by mouth daily.   Yes Historical Provider, MD  metFORMIN (GLUCOPHAGE) 500 MG tablet Take 500 mg by mouth 2 (two) times daily  with a meal.   Yes Historical Provider, MD  clotrimazole (LOTRIMIN) 1 % cream Apply to affected area 2 times daily 03/09/15   Gilda Crease, MD  sulfamethoxazole-trimethoprim (BACTRIM DS,SEPTRA DS) 800-160 MG tablet Take 1 tablet by mouth 2 (two) times daily. 03/09/15 03/16/15  Gilda Crease, MD  traMADol (ULTRAM) 50 MG tablet Take 1 tablet (50 mg total) by mouth every 6 (six) hours as needed. 03/09/15   Gilda Crease, MD   Triage Vitals: BP 140/102 mmHg  Pulse 88  Temp(Src) 97.8 F (36.6 C) (Oral)  Resp 20  Ht  (1.905 m)  SpO2 98%   Physical Exam  Constitutional: He is oriented to person, place, and time. He appears well-developed and well-nourished. No distress.  HENT:  Head: Normocephalic and atraumatic.  Right Ear: Hearing normal.  Left Ear: Hearing normal.  Nose: Nose normal.  Mouth/Throat: Oropharynx is clear and moist and mucous membranes are normal.  Eyes: Conjunctivae and EOM are normal. Pupils are equal, round, and reactive to light.  Neck: Normal range of motion. Neck supple.  Cardiovascular: Regular rhythm, S1 normal and S2 normal.  Exam reveals no gallop and no friction rub.   No murmur heard. Pulmonary/Chest: Effort normal and breath sounds normal. No respiratory distress. He exhibits no tenderness.  Abdominal: Soft. Normal appearance and bowel sounds are normal. There is no hepatosplenomegaly. There is no tenderness. There is no rebound, no guarding, no tenderness at McBurney's point and negative Murphy's sign. No hernia.  Musculoskeletal: Normal  range of motion.  Neurological: He is alert and oriented to person, place, and time. He has normal strength. No cranial nerve deficit or sensory deficit. Coordination normal. GCS eye subscore is 4. GCS verbal subscore is 5. GCS motor subscore is 6.  Skin: Skin is warm, dry and intact. No cyanosis.  Slightly erythematous left pink toe with swelling and tenderness  No drainage No fluctuance Diffuse  scaling between the web spaces of all the toes  Psychiatric: He has a normal mood and affect. His speech is normal and behavior is normal. Thought content normal.  Nursing note and vitals reviewed.   ED Course  Procedures (including critical care time)  DIAGNOSTIC STUDIES: Oxygen Saturation is 98% on RA, normal by my interpretation.    COORDINATION OF CARE: 11:16 PM-Discussed treatment plan which includes CBG with pt at bedside and pt agreed to plan.   Labs Review Labs Reviewed  CBG MONITORING, ED    Imaging Review No results found..   EKG Interpretation None      MDM   Final diagnoses:  Athlete's foot on left  Cellulitis of toe of left foot   Complains of pain in his left small toe after wearing shoes that were too tight. Patient does have evidence of tinea pedis of both feet. There is, however, tenderness and slight erythema of the left small toe. There is no drainage. Suspect very early cellulitis. Patient is a diabetic, blood sugar is normal. Will treat topically for tinea pedis and had antibiotic coverage in the form of Bactrim.      Gilda Creasehristopher J Pollina, MD 03/10/15 [phone]

## 2015-03-09 NOTE — ED Notes (Signed)
Pt states his shoes are too tight and have been squeezing his feet and now he is having pain.  Calloused 5th toe of left foot

## 2015-03-09 NOTE — Discharge Instructions (Signed)
Athlete's Foot Athlete's foot (tinea pedis) is a fungal infection of the skin on the feet. It often occurs on the skin between the toes or underneath the toes. It can also occur on the soles of the feet. Athlete's foot is more likely to occur in hot, humid weather. Not washing your feet or changing your socks often enough can contribute to athlete's foot. The infection can spread from person to person (contagious). CAUSES Athlete's foot is caused by a fungus. This fungus thrives in warm, moist places. Most people get athlete's foot by sharing shower stalls, towels, and wet floors with an infected person. People with weakened immune systems, including those with diabetes, may be more likely to get athlete's foot. SYMPTOMS   Itchy areas between the toes or on the soles of the feet.  White, flaky, or scaly areas between the toes or on the soles of the feet.  Tiny, intensely itchy blisters between the toes or on the soles of the feet.  Tiny cuts on the skin. These cuts can develop a bacterial infection.  Thick or discolored toenails. DIAGNOSIS  Your caregiver can usually tell what the problem is by doing a physical exam. Your caregiver may also take a skin sample from the rash area. The skin sample may be examined under a microscope, or it may be tested to see if fungus will grow in the sample. A sample may also be taken from your toenail for testing. TREATMENT  Over-the-counter and prescription medicines can be used to kill the fungus. These medicines are available as powders or creams. Your caregiver can suggest medicines for you. Fungal infections respond slowly to treatment. You may need to continue using your medicine for several weeks. PREVENTION   Do not share towels.  Wear sandals in wet areas, such as shared locker rooms and shared showers.  Keep your feet dry. Wear shoes that allow air to circulate. Wear cotton or wool socks. HOME CARE INSTRUCTIONS   Take medicines as directed by  your caregiver. Do not use steroid creams on athlete's foot.  Keep your feet clean and cool. Wash your feet daily and dry them thoroughly, especially between your toes.  Change your socks every day. Wear cotton or wool socks. In hot climates, you may need to change your socks 2 to 3 times per day.  Wear sandals or canvas tennis shoes with good air circulation.  If you have blisters, soak your feet in Burow's solution or Epsom salts for 20 to 30 minutes, 2 times a day to dry out the blisters. Make sure you dry your feet thoroughly afterward. SEEK MEDICAL CARE IF:   You have a fever.  You have swelling, soreness, warmth, or redness in your foot.  You are not getting better after 7 days of treatment.  You are not completely cured after 30 days.  You have any problems caused by your medicines. MAKE SURE YOU:   Understand these instructions.  Will watch your condition.  Will get help right away if you are not doing well or get worse.   This information is not intended to replace advice given to you by your health care provider. Make sure you discuss any questions you have with your health care provider.   Document Released: 04/20/2000 Document Revised: 07/16/2011 Document Reviewed: 10/25/2014 Elsevier Interactive Patient Education 2016 Else[employer] Inc[employer]  Cellulitis Cellulitis is an infection of the skin and the tissue beneath it. The infected area is usually red and tender. Cellulitis occurs most often in  the arms and lower legs.  CAUSES  Cellulitis is caused by bacteria that enter the skin through cracks or cuts in the skin. The most common types of bacteria that cause cellulitis are staphylococci and streptococci. SIGNS AND SYMPTOMS   Redness and warmth.  Swelling.  Tenderness or pain.  Fever. DIAGNOSIS  Your health care provider can usually determine what is wrong based on a physical exam. Blood tests may also be done. TREATMENT  Treatment usually involves taking an  antibiotic medicine. HOME CARE INSTRUCTIONS   Take your antibiotic medicine as directed by your health care provider. Finish the antibiotic even if you start to feel better.  Keep the infected arm or leg elevated to reduce swelling.  Apply a warm cloth to the affected area up to 4 times per day to relieve pain.  Take medicines only as directed by your health care provider.  Keep all follow-up visits as directed by your health care provider. SEEK MEDICAL CARE IF:   You notice red streaks coming from the infected area.  Your red area gets larger or turns dark in color.  Your bone or joint underneath the infected area becomes painful after the skin has healed.  Your infection returns in the same area or another area.  You notice a swollen bump in the infected area.  You develop new symptoms.  You have a fever. SEEK IMMEDIATE MEDICAL CARE IF:   You feel very sleepy.  You develop vomiting or diarrhea.  You have a general ill feeling (malaise) with muscle aches and pains.   This information is not intended to replace advice given to you by your health care provider. Make sure you discuss any questions you have with your health care provider.   Document Released: 01/31/2005 Document Revised: 01/12/2015 Document Reviewed: 07/09/2011 Elsevier Interactive Patient Education [employer] Inc[employer]

## 2015-03-09 NOTE — ED Notes (Signed)
Pt states he wore shoes that didn't really fit him and he wore them for 2 days.  Pt states his left pinky toe is hurting but denies injury

## 2015-03-10 ENCOUNTER — Emergency Department (HOSPITAL_COMMUNITY)
Admission: EM | Admit: 2015-03-10 | Discharge: 2015-03-10 | Disposition: A | Discharge status: Home / Self Care | Payer: Self-pay | Attending: Emergency Medicine | Admitting: Emergency Medicine

## 2015-03-10 ENCOUNTER — Encounter (HOSPITAL_COMMUNITY): Payer: Self-pay

## 2015-03-10 ENCOUNTER — Emergency Department (HOSPITAL_COMMUNITY): Payer: Self-pay

## 2015-03-10 DIAGNOSIS — B353 Tinea pedis: Secondary | ICD-10-CM | POA: Insufficient documentation

## 2015-03-10 DIAGNOSIS — E119 Type 2 diabetes mellitus without complications: Secondary | ICD-10-CM | POA: Insufficient documentation

## 2015-03-10 DIAGNOSIS — L02612 Cutaneous abscess of left foot: Secondary | ICD-10-CM | POA: Insufficient documentation

## 2015-03-10 DIAGNOSIS — Z79899 Other long term (current) drug therapy: Secondary | ICD-10-CM | POA: Insufficient documentation

## 2015-03-10 DIAGNOSIS — Z792 Long term (current) use of antibiotics: Secondary | ICD-10-CM | POA: Insufficient documentation

## 2015-03-10 DIAGNOSIS — I10 Essential (primary) hypertension: Secondary | ICD-10-CM | POA: Insufficient documentation

## 2015-03-10 DIAGNOSIS — Z720 Tobacco use: Secondary | ICD-10-CM | POA: Insufficient documentation

## 2015-03-10 MED ORDER — BUPIVACAINE HCL (PF) 0.5 % IJ SOLN
10.0000 mL | Freq: Once | INTRAMUSCULAR | Status: AC
Start: 2015-03-10 — End: 2015-03-10
  Administered 2015-03-10: 10 mL
  Filled 2015-03-10: qty 30

## 2015-03-10 MED ORDER — BACITRACIN ZINC 500 UNIT/GM EX OINT
TOPICAL_OINTMENT | CUTANEOUS | Status: AC
  Filled 2015-03-10: qty 0.9

## 2015-03-10 MED ORDER — OXYCODONE-ACETAMINOPHEN 5-325 MG PO TABS
1.0000 | ORAL_TABLET | Freq: Once | ORAL | Status: AC
  Administered 2015-03-10: 1 via ORAL
  Filled 2015-03-10: qty 1

## 2015-03-10 MED ORDER — HYDROCODONE-ACETAMINOPHEN 5-325 MG PO TABS: 2.0000 | ORAL_TABLET | ORAL | Status: DC | PRN

## 2015-03-10 NOTE — Discharge Instructions (Signed)
Abscess An abscess is an infected area that contains a collection of pus and debris.It can occur in almost any part of the body. An abscess is also known as a furuncle or boil. CAUSES  An abscess occurs when tissue gets infected. This can occur from blockage of oil or sweat glands, infection of hair follicles, or a minor injury to the skin. As the body tries to fight the infection, pus collects in the area and creates pressure under the skin. This pressure causes pain. People with weakened immune systems have difficulty fighting infections and get certain abscesses more often.  SYMPTOMS Usually an abscess develops on the skin and becomes a painful mass that is red, warm, and tender. If the abscess forms under the skin, you may feel a moveable soft area under the skin. Some abscesses break open (rupture) on their own, but most will continue to get worse without care. The infection can spread deeper into the body and eventually into the bloodstream, causing you to feel ill.  DIAGNOSIS  Your caregiver will take your medical history and perform a physical exam. A sample of fluid may also be taken from the abscess to determine what is causing your infection. TREATMENT  Your caregiver may prescribe antibiotic medicines to fight the infection. However, taking antibiotics alone usually does not cure an abscess. Your caregiver may need to make a small cut (incision) in the abscess to drain the pus. In some cases, gauze is packed into the abscess to reduce pain and to continue draining the area. HOME CARE INSTRUCTIONS   Only take over-the-counter or prescription medicines for pain, discomfort, or fever as directed by your caregiver.  If you were prescribed antibiotics, take them as directed. Finish them even if you start to feel better.  If gauze is used, follow your caregiver's directions for changing the gauze.  To avoid spreading the infection:  Keep your draining abscess covered with a  bandage.  Wash your hands well.  Do not share personal care items, towels, or whirlpools with others.  Avoid skin contact with others.  Keep your skin and clothes clean around the abscess.  Keep all follow-up appointments as directed by your caregiver. SEEK MEDICAL CARE IF:   You have increased pain, swelling, redness, fluid drainage, or bleeding.  You have muscle aches, chills, or a general ill feeling.  You have a fever. MAKE SURE YOU:   Understand these instructions.  Will watch your condition.  Will get help right away if you are not doing well or get worse.   This information is not intended to replace advice given to you by your health care provider. Make sure you discuss any questions you have with your health care provider.   Document Released: 01/31/2005 Document Revised: 10/23/2011 Document Reviewed: 07/06/2011 Elsevier Interactive Patient Education 2016 Elsevier Inc. Diabetes and Foot Care Diabetes may cause you to have problems because of poor blood supply (circulation) to your feet and legs. This may cause the skin on your feet to become thinner, break easier, and heal more slowly. Your skin may become dry, and the skin may peel and crack. You may also have nerve damage in your legs and feet causing decreased feeling in them. You may not notice minor injuries to your feet that could lead to infections or more serious problems. Taking care of your feet is one of the most important things you can do for yourself.  HOME CARE INSTRUCTIONS  Wear shoes at all times, even in the  house. Do not go barefoot. Bare feet are easily injured.  Check your feet daily for blisters, cuts, and redness. If you cannot see the bottom of your feet, use a mirror or ask someone for help.  Wash your feet with warm water (do not use hot water) and mild soap. Then pat your feet and the areas between your toes until they are completely dry. Do not soak your feet as this can dry your  skin.  Apply a moisturizing lotion or petroleum jelly (that does not contain alcohol and is unscented) to the skin on your feet and to dry, brittle toenails. Do not apply lotion between your toes.  Trim your toenails straight across. Do not dig under them or around the cuticle. File the edges of your nails with an emery board or nail file.  Do not cut corns or calluses or try to remove them with medicine.  Wear clean socks or stockings every day. Make sure they are not too tight. Do not wear knee-high stockings since they may decrease blood flow to your legs.  Wear shoes that fit properly and have enough cushioning. To break in new shoes, wear them for just a few hours a day. This prevents you from injuring your feet. Always look in your shoes before you put them on to be sure there are no objects inside.  Do not cross your legs. This may decrease the blood flow to your feet.  If you find a minor scrape, cut, or break in the skin on your feet, keep it and the skin around it clean and dry. These areas may be cleansed with mild soap and water. Do not cleanse the area with peroxide, alcohol, or iodine.  When you remove an adhesive bandage, be sure not to damage the skin around it.  If you have a wound, look at it several times a day to make sure it is healing.  Do not use heating pads or hot water bottles. They may burn your skin. If you have lost feeling in your feet or legs, you may not know it is happening until it is too late.  Make sure your health care provider performs a complete foot exam at least annually or more often if you have foot problems. Report any cuts, sores, or bruises to your health care provider immediately. SEEK MEDICAL CARE IF:   You have an injury that is not healing.  You have cuts or breaks in the skin.  You have an ingrown nail.  You notice redness on your legs or feet.  You feel burning or tingling in your legs or feet.  You have pain or cramps in your  legs and feet.  Your legs or feet are numb.  Your feet always feel cold. SEEK IMMEDIATE MEDICAL CARE IF:   There is increasing redness, swelling, or pain in or around a wound.  There is a red line that goes up your leg.  Pus is coming from a wound.  You develop a fever or as directed by your health care provider.  You notice a bad smell coming from an ulcer or wound.   This information is not intended to replace advice given to you by your health care provider. Make sure you discuss any questions you have with your health care provider.   Document Released: 04/20/2000 Document Revised: 12/24/2012 Document Reviewed: 09/30/2012 Elsevier Interactive Patient Education [employer]

## 2015-03-10 NOTE — ED Notes (Signed)
   03/10/15 0312  Musculoskeletal  Musculoskeletal (WDL) X  LLE Full movement;Other (Comment) (Pt c/o left pinky toe pain and denies any injury. )

## 2015-03-10 NOTE — ED Notes (Signed)
Pt here earlier for left pinky toe pain, states pain is worse since he was here earlier

## 2015-03-10 NOTE — ED Provider Notes (Signed)
CSN: [id number]     Arrival date & time 03/10/15  0241 History   First MD Initiated Contact with Patient 03/10/15 0301     Chief Complaint  Patient presents with  . Toe Pain     (Consider location/radiation/quality/duration/timing/severity/associated sxs/prior Treatment) HPI Comments: Patient returns to the ER with continued complaints of left pinky toe pain. Patient was seen earlier tonight with similar complaints. Patient did have mild swelling of the toe with slight erythema, was initiated on antibiotics. He also has evidence of tinea pedis, was prescribed Lotrimin. Patient reports that since he left the hospital, however, the pain is becoming more severe.  Patient is a 29 y.o. male presenting with toe pain.  Toe Pain    Past Medical History  Diagnosis Date  . Diabetes mellitus without complication (HCC)   . Hypertension    Past Surgical History  Procedure Laterality Date  . Tonsillectomy    . Appendectomy    . Adenoidectomy     No family history on file. Social History  Substance Use Topics  . Smoking status: Current Every Day Smoker -- 0.50 packs/day    Types: Cigarettes  . Smokeless tobacco: Never Used  . Alcohol Use: No    Review of Systems  Musculoskeletal: Positive for arthralgias.  All other systems reviewed and are negative.     Allergies  Review of patient's allergies indicates no known allergies.  Home Medications   Prior to Admission medications   Medication Sig Start Date End Date Taking? Authorizing Provider  clotrimazole (LOTRIMIN) 1 % cream Apply to affected area 2 times daily 03/09/15   Gilda Crease, MD  lisinopril-hydrochlorothiazide (PRINZIDE,ZESTORETIC) 20-12.5 MG per tablet Take 1 tablet by mouth daily.    Historical Provider, MD  metFORMIN (GLUCOPHAGE) 500 MG tablet Take 500 mg by mouth 2 (two) times daily with a meal.    Historical Provider, MD  sulfamethoxazole-trimethoprim (BACTRIM DS,SEPTRA DS) 800-160 MG tablet Take 1 tablet  by mouth 2 (two) times daily. 03/09/15 03/16/15  Gilda Crease, MD  traMADol (ULTRAM) 50 MG tablet Take 1 tablet (50 mg total) by mouth every 6 (six) hours as needed. 03/09/15   Gilda Crease, MD   BP 150/96 mmHg  Pulse 79  Temp(Src) 97.9 F (36.6 C) (Oral)  Resp 22  SpO2 98% Physical Exam  Constitutional: He is oriented to person, place, and time. He appears well-developed and well-nourished. No distress.  HENT:  Head: Normocephalic and atraumatic.  Right Ear: Hearing normal.  Left Ear: Hearing normal.  Nose: Nose normal.  Mouth/Throat: Oropharynx is clear and moist and mucous membranes are normal.  Eyes: Conjunctivae and EOM are normal. Pupils are equal, round, and reactive to light.  Neck: Normal range of motion. Neck supple.  Cardiovascular: Regular rhythm, S1 normal and S2 normal.  Exam reveals no gallop and no friction rub.   No murmur heard. Pulmonary/Chest: Effort normal and breath sounds normal. No respiratory distress. He exhibits no tenderness.  Abdominal: Soft. Normal appearance and bowel sounds are normal. There is no hepatosplenomegaly. There is no tenderness. There is no rebound, no guarding, no tenderness at McBurney's point and negative Murphy's sign. No hernia.  Musculoskeletal: Normal range of motion.  Severe tenderness of left pinky toe with very slight swelling. No drainage. No fluctuance. No induration.  Neurological: He is alert and oriented to person, place, and time. He has normal strength. No cranial nerve deficit or sensory deficit. Coordination normal. GCS eye subscore is 4. GCS verbal subscore  is 5. GCS motor subscore is 6.  Skin: Skin is warm, dry and intact. No rash noted. No cyanosis.  Diffuse patchy thickening of skin and webspaces between all toes  Psychiatric: He has a normal mood and affect. His speech is normal and behavior is normal. Thought content normal.  Nursing note and vitals reviewed.   ED Course  Procedures (including  critical care time)  Procedure: Digital Block Area prepped with betadine and sterile towels applied. Landmarks identified. A 27-guage 1 1/4 inch needle was advanced dorsally, adjacent to the base of the proximal phalynx. Aspiration revealed no blood return. 3ml of 0.5% bupivacaine was injected. The steps were repeated on the other side of the phalynx. Patient tolerated the procedure well and there were no complications.  Procedure: Sharp debridement left small toe Area was prepped with chlorhexidine and sterile drapes. A 0.75 cm diameter area of callus was excised with a 15 blade. There was underlying blister fluid as well as pus. This was fully cleaned and the area was sterilely dressed. Patient tolerated the procedure well and there were no complications.  Labs Review Labs Reviewed - No data to display  Imaging Review No results found. I have personally reviewed and evaluated these images and lab results as part of my medical decision-making.   EKG Interpretation None      MDM   Final diagnoses:  None   blister  Skin abscess  Patient returns to the ER for evaluation of continued toe pain. Pain seems out of proportion to the exam. I recommended digital block so I could further evaluate. Patient did consent to the center block was performed with bupivacaine. It was noted that he had a very thick callus over tip of the toe. I recommended unroofing this. This was performed and there was a moderate amount of blister fluid and also a moderate amount of pus underneath the callus consistent with infected blister in small abscess formation. I recommended the Bactrim that was prescribed at previous visit and will also be given additional pain medicine. Patient is to follow-up with a podiatrist as soon as possible. Return to the ER for worsening symptoms.    Gilda Creasehristopher J Ndia Sampath, MD 03/10/15 0400

## 2015-03-28 ENCOUNTER — Emergency Department (HOSPITAL_COMMUNITY): Payer: Self-pay

## 2015-03-28 ENCOUNTER — Encounter (HOSPITAL_COMMUNITY): Payer: Self-pay

## 2015-03-28 ENCOUNTER — Emergency Department (HOSPITAL_COMMUNITY)
Admission: EM | Admit: 2015-03-28 | Discharge: 2015-03-28 | Disposition: A | Discharge status: Home / Self Care | Payer: Self-pay | Attending: Emergency Medicine | Admitting: Emergency Medicine

## 2015-03-28 DIAGNOSIS — Z7984 Long term (current) use of oral hypoglycemic drugs: Secondary | ICD-10-CM | POA: Insufficient documentation

## 2015-03-28 DIAGNOSIS — I10 Essential (primary) hypertension: Secondary | ICD-10-CM | POA: Insufficient documentation

## 2015-03-28 DIAGNOSIS — M79672 Pain in left foot: Secondary | ICD-10-CM | POA: Insufficient documentation

## 2015-03-28 DIAGNOSIS — E119 Type 2 diabetes mellitus without complications: Secondary | ICD-10-CM | POA: Insufficient documentation

## 2015-03-28 DIAGNOSIS — Z79899 Other long term (current) drug therapy: Secondary | ICD-10-CM | POA: Insufficient documentation

## 2015-03-28 DIAGNOSIS — F17210 Nicotine dependence, cigarettes, uncomplicated: Secondary | ICD-10-CM | POA: Insufficient documentation

## 2015-03-28 MED ORDER — CEPHALEXIN 500 MG PO CAPS: 500.0000 mg | ORAL_CAPSULE | Freq: Four times a day (QID) | ORAL | Status: DC

## 2015-03-28 MED ORDER — HYDROMORPHONE HCL 1 MG/ML IJ SOLN
1.0000 mg | Freq: Once | INTRAMUSCULAR | Status: AC
  Administered 2015-03-28: 1 mg via INTRAMUSCULAR
  Filled 2015-03-28: qty 1

## 2015-03-28 MED ORDER — TRAMADOL HCL 50 MG PO TABS: 50.0000 mg | ORAL_TABLET | Freq: Four times a day (QID) | ORAL | Status: DC | PRN

## 2015-03-28 NOTE — ED Provider Notes (Signed)
CSN: [id number]     Arrival date & time 03/28/15  1959 History  By signing my name below, I, Brandon Esparza, attest that this documentation has been prepared under the direction and in the presence of Raeford Razor, MD. Electronically Signed: Budd Esparza, ED Scribe. 03/28/2015. 10:21 PM.    Chief Complaint  Patient presents with  . Foot Pain   The history is provided by the patient. No language interpreter was used.   HPI Comments: Brandon Esparza is a 29 y.o. male smoker at 0.5 ppd with a PMHx of HTN and DM who presents to the Emergency Department complaining of gradually worsening, constant, aching, left foot pain between the first two toes onset earlier today while at work. He denies any recent falls or injuries. He reports associated swelling to the foot as well as inability to bear weight due to the pain. He states he has not taken anything for pain today. He notes he is almost finished with his antibiotics that he was prescribed when last he came to the ED (11/3). He notes he is on 2 pills of metformin per day for his DM, but has not been taking the medication. Pt denies a PMHx of gout. He has NKDA.   Past Medical History  Diagnosis Date  . Diabetes mellitus without complication (HCC)   . Hypertension    Past Surgical History  Procedure Laterality Date  . Tonsillectomy    . Appendectomy    . Adenoidectomy     History reviewed. No pertinent family history. Social History  Substance Use Topics  . Smoking status: Current Every Day Smoker -- 0.50 packs/day    Types: Cigarettes  . Smokeless tobacco: Never Used  . Alcohol Use: No    Review of Systems  Musculoskeletal: Positive for myalgias, arthralgias and gait problem.  All other systems reviewed and are negative.   Allergies  Review of patient's allergies indicates no known allergies.  Home Medications   Prior to Admission medications   Medication Sig Start Date End Date Taking? Authorizing Provider   lisinopril-hydrochlorothiazide (PRINZIDE,ZESTORETIC) 20-12.5 MG per tablet Take 1 tablet by mouth daily.   Yes Historical Provider, MD  metFORMIN (GLUCOPHAGE) 500 MG tablet Take 500 mg by mouth 2 (two) times daily with a meal.   Yes Historical Provider, MD  HYDROcodone-acetaminophen (NORCO/VICODIN) 5-325 MG tablet Take 2 tablets by mouth every 4 (four) hours as needed for moderate pain. Patient not taking: Reported on 03/28/2015 03/10/15   Gilda Crease, MD  traMADol (ULTRAM) 50 MG tablet Take 1 tablet (50 mg total) by mouth every 6 (six) hours as needed. Patient not taking: Reported on 03/28/2015 03/09/15   Gilda Crease, MD   BP 190/121 mmHg  Pulse 100  Temp(Src) 98.1 F (36.7 C) (Oral)  Resp 14  Ht  (1.93 m)  Wt 350 lb (158.759 kg)  BMI 42.62 kg/m2  SpO2 98% Physical Exam  Constitutional: He appears well-developed and well-nourished.  HENT:  Head: Normocephalic and atraumatic.  Eyes: Conjunctivae are normal. Right eye exhibits no discharge. Left eye exhibits no discharge.  Pulmonary/Chest: Effort normal. No respiratory distress.  Musculoskeletal: He exhibits edema and tenderness.  Mild diffuse swelling to L foot and ankle. TTP distal foot along the MP joints of 1st and 2nd toes. Some cracking in the web space between the 1st and 2nd toes. Diffuse skin thickening and calouses. Tinea Pedis. No erythema, no increased warmth.  Neurological: He is alert. Coordination normal.  Skin: Skin is warm  and dry. No rash noted. He is not diaphoretic. No erythema.  Psychiatric: He has a normal mood and affect.  Nursing note and vitals reviewed.   ED Course  Procedures  DIAGNOSTIC STUDIES: Oxygen Saturation is 98% on RA, normal by my interpretation.    COORDINATION OF CARE: 10:10 PM - Discussed plans to order diagnostic imaging and pain medication. Pt advised of plan for treatment and pt agrees.  Labs Review Labs Reviewed - No data to display  Imaging Review Dg Foot  Complete Left  03/28/2015  CLINICAL DATA:  Gradually worsening constant and aching left foot pain between the first to toes earlier today while at work. No discrete injury. Swelling and inability to bear weight. History of diabetes. EXAM: LEFT FOOT - COMPLETE 3+ VIEW COMPARISON:  03/10/2015 FINDINGS: Diffuse soft tissue swelling medial to the left hindfoot. Soft tissue swelling over the dorsum of the left foot. Bones appear intact. No evidence of acute fracture or subluxation. No focal bone lesion or bone destruction. Bone cortex and trabecular architecture appear intact. No radiopaque soft tissue foreign bodies. IMPRESSION: Soft tissue swelling.  No acute bony abnormalities. Electronically Signed   By: Burman NievesWilliam  Stevens M.D.   On: 03/28/2015 22:39   I have personally reviewed and evaluated these images and lab results as part of my medical decision-making.   EKG Interpretation None      MDM   Final diagnoses:  Left foot pain    I personally preformed the services scribed in my presence. The recorded information has been reviewed is accurate. Raeford RazorStephen Averie Meiner, MD.   [phone] with L foot pain. Recently seen for same. Suspect may be developing mild cellulitis distal foot. Abx. It has been determined that no acute conditions requiring further emergency intervention are present at this time. The patient has been advised of the diagnosis and plan. I reviewed any labs and imaging including any potential incidental findings. We have discussed signs and symptoms that warrant return to the ED and they are listed in the discharge instructions.    Raeford RazorStephen Kaleen Rochette, MD 04/12/15 [phone]

## 2015-03-28 NOTE — ED Notes (Signed)
Patient c/o left foot pain with swelling.

## 2015-03-28 NOTE — Discharge Instructions (Signed)
Musculoskeletal Pain °Musculoskeletal pain is muscle and boney aches and pains. These pains can occur in any part of the body. Your caregiver may treat you without knowing the cause of the pain. They may treat you if blood or urine tests, X-rays, and other tests were normal.  °CAUSES °There is often not a definite cause or reason for these pains. These pains may be caused by a type of germ (virus). The discomfort may also come from overuse. Overuse includes working out too hard when your body is not fit. Boney aches also come from weather changes. Bone is sensitive to atmospheric pressure changes. °HOME CARE INSTRUCTIONS  °· Ask when your test results will be ready. Make sure you get your test results. °· Only take over-the-counter or prescription medicines for pain, discomfort, or fever as directed by your caregiver. If you were given medications for your condition, do not drive, operate machinery or power tools, or sign legal documents for 24 hours. Do not drink alcohol. Do not take sleeping pills or other medications that may interfere with treatment. °· Continue all activities unless the activities cause more pain. When the pain lessens, slowly resume normal activities. Gradually increase the intensity and duration of the activities or exercise. °· During periods of severe pain, bed rest may be helpful. Lay or sit in any position that is comfortable. °· Putting ice on the injured area. °¨ Put ice in a bag. °¨ Place a towel between your skin and the bag. °¨ Leave the ice on for 15 to 20 minutes, 3 to 4 times a day. °· Follow up with your caregiver for continued problems and no reason can be found for the pain. If the pain becomes worse or does not go away, it may be necessary to repeat tests or do additional testing. Your caregiver may need to look further for a possible cause. °SEEK IMMEDIATE MEDICAL CARE IF: °· You have pain that is getting worse and is not relieved by medications. °· You develop chest pain  that is associated with shortness or breath, sweating, feeling sick to your stomach (nauseous), or throw up (vomit). °· Your pain becomes localized to the abdomen. °· You develop any new symptoms that seem different or that concern you. °MAKE SURE YOU:  °· Understand these instructions. °· Will watch your condition. °· Will get help right away if you are not doing well or get worse. °  °This information is not intended to replace advice given to you by your health care provider. Make sure you discuss any questions you have with your health care provider. °  °Document Released: 04/23/2005 Document Revised: 07/16/2011 Document Reviewed: 12/26/2012 °Elsevier Interactive Patient Education ©2016 Elsevier Inc. ° °Emergency Department Resource Guide °1) Find a Doctor and Pay Out of Pocket °Although you won't have to find out who is covered by your insurance plan, it is a good idea to ask around and get recommendations. You will then need to call the office and see if the doctor you have chosen will accept you as a new patient and what types of options they offer for patients who are self-pay. Some doctors offer discounts or will set up payment plans for their patients who do not have insurance, but you will need to ask so you aren't surprised when you get to your appointment. ° °2) Contact Your Local Health Department °Not all health departments have doctors that can see patients for sick visits, but many do, so it is worth a call to   see if yours does. If you don't know where your local health department is, you can check in your phone book. The CDC also has a tool to help you locate your state's health department, and many state websites also have listings of all of their local health departments. ° °3) Find a Walk-in Clinic °If your illness is not likely to be very severe or complicated, you may want to try a walk in clinic. These are popping up all over the country in pharmacies, drugstores, and shopping centers.  They're usually staffed by nurse practitioners or physician assistants that have been trained to treat common illnesses and complaints. They're usually fairly quick and inexpensive. However, if you have serious medical issues or chronic medical problems, these are probably not your best option. ° °No Primary Care Doctor: °- Call [facility] at  832-8000 - they can help you locate a primary care doctor that  accepts your insurance, provides certain services, etc. °- Physician Referral Service- 1-800-533-3463 ° °Chronic Pain Problems: °Organization         Address  Phone   Notes  °[facility]  (336) 297-2271 Patients need to be referred by their primary care doctor.  ° °Medication Assistance: °Organization         Address  Phone   Notes  °[place] [facility] [address] °[place], [place] [address] (336) 641-8030 --Must be a resident of [place] °-- Must have NO insurance coverage whatsoever (no Medicaid/ Medicare, etc.) °-- The pt. MUST have a primary care doctor that directs their care regularly and follows them in the community °  °[facility]  (866) 331-1348   °[employer]  (888) 892-1162   ° °Agencies that provide inexpensive medical care: °Organization         Address  Phone   Notes  °[facility]  (336) 832-8035   °[facility]    (336) 832-7272   °[facility] [address] °[place], [place] [address] (336) 832-4777   °[facility] [address], °[place] (336) 271-4999   °[facility]    (336) 373-0678   °[facility]    (336) 272-1050   °[facility] ° [address], [place] Phone:  (336) 832-4444, Fax:  (336) 832-4440 Hours of Operation:  9 am - 6 pm, M-F.  Also accepts Medicaid/Medicare and self-pay.  ° [facility] ° [address], [place] Phone: (336) 832-3150, Fax: (336)  832-3151. Hours of Operation:  8:30 am - 5:30 pm, M-F.  Also accepts Medicaid and self-pay.  °[facility] [place] [address], [place] Phone: (336) 878-6027   °[facility] [address], [place], [place] (336)723-1848, Ext. 123 Mondays & Thursdays: 7-9 AM.  First 15 patients are seen on a first come, first serve basis. °  ° °Medicaid-accepting [place] Providers: ° °Organization         Address  Phone   Notes  °[facility] [address], [place] (336) 641-2100 Also accepts self-pay patients.  °[facility] [address], [place] ° (336) 856-9996   °[facility] [address], [place] (336) 288-8857   °[facility] [address] [place] [address], [place] (336) 299-7000   °Veita Bland [address],   ° (  336) 373-1557 Only accepts [place] Access Medicaid patients after they have their name applied to their card.  ° °Self-Pay (no insurance) in [place]: ° °Organization         Address  Phone   Notes  °Sickle Cell Patients, [facility] [address], [place] (336) 832-1970   °[facility] [address], [place] (336) 832-4400   ° [facility] ° [address], [place] (336) 992-4800   °[facility]/Dr. Osei-Bonsu ° [address] [place] [address], [place] or [address], [place] (336) 841-8500 Phone number for both [place] and [place] locations is the same.  °[facility] [address], [place] (336) 299-0000   °[facility] [address] [place] [address], [place] or [address] (336) 852-7530 °(336) 878-2260   °[facility] [address], [place] (336) 350-1642, phone; (336) 294-5005, fax Sees patients 1st and 3rd Saturday of every month.  Must not qualify for public or private insurance (i.e.  Medicaid, Medicare, [place] Health Choice, Veterans' Benefits) • Household income should be no more than 200% of the poverty level •The clinic cannot treat you if you are pregnant or think you are pregnant • Sexually transmitted diseases are not treated at the clinic.  ° ° °Dental Care: °Organization         Address  Phone  Notes  °[facility] [address], [place] (336) 641-6152 Accepts children up to age 21 who are enrolled in Medicaid or [place] Health Choice; pregnant women with a Medicaid card; and children who have applied for Medicaid or [place] Health Choice, but were declined, whose parents can pay a reduced fee at time of service.  °[facility] [place]  [address], [place] (336) 641-7733 Accepts children up to age 21 who are enrolled in Medicaid or [place] Health Choice; pregnant women with a Medicaid card; and children who have applied for Medicaid or [place] Health Choice, but were declined, whose parents can pay a reduced fee at time of service.  °[facility] ° [address], [place] (336) 641-4533 Patients are seen by appointment only. Walk-ins are not accepted. [facility] will see patients 18 years of age and older. °Monday - Tuesday (8am-5pm) °Most Wednesdays (8:30-5pm) °$30 per visit, cash only  °[facility] ° [address], [place] (336) 641-4533 Patients are seen by appointment only. Walk-ins are not accepted. [facility] will see patients 18 years of age and older. °One Wednesday Evening (Monthly: Volunteer Based).  $30 per visit, cash only  °[facility]  (919) 537-3737 for adults; Children under age 4, call Graduate Pediatric Dentistry at ([phone]) [phone]. Children aged 4-14, please call (919) [phone] to request a pediatric application. ° Dental services are provided in all areas of dental care including fillings,  crowns and bridges, complete and partial dentures, implants, gum treatment, root canals, and extractions. Preventive care is also provided. Treatment is provided to both adults and children. °Patients are selected via a lottery and there is often a waiting list. °  °[facility] [address], °[place] ° (336) 763-8833 [email or web address] °  °[facility] [address], [place], [place] (336)723-1848, Ext. 123 Second and Fourth Thursday of each month, opens at 6:30   AM; Clinic ends at 9 AM.  Patients are seen on a first-come first-served basis, and a limited number are seen during each clinic.  ° °[facility] ° [address], [place], [place] (336) 723-7904   Eligibility Requirements °You must have lived in [place], [place], or [place] counties for at least the last three months. °  You cannot be eligible for state or federal sponsored healthcare insurance, including Veterans Administration, Medicaid, or Medicare. °  You generally cannot be eligible for healthcare insurance through your employer.  °  How to apply: °Eligibility screenings are held every Tuesday and Wednesday afternoon from 1:00 pm until 4:00 pm. You do not need an appointment for the interview!  °[facility] [address], [place], [place] 336-631-2330   °[facility]  336-342-8273   °[facility]  336-703-3100   °[facility]  336-570-6415   ° °Behavioral Health Resources in the Community: °Intensive Outpatient Programs °Organization         Address  Phone  Notes  °[facility] [address], [place], [place] 336-878-6098   °[facility] [address], [place], [place] 336-832-9800   °[facility] [address], [place], [place] ° 336-882-2125   °[facility] [address],  °[place], [place] 1-800-853-5163 or 336-641-4981   °Substance Abuse  Resources °Organization         Address  Phone  Notes  °[facility]  336-882-2125   °[facility]  336-784-9470   °[facility]  336-285-9073   °[facility]  336-845-3988   °Residential & Outpatient Substance Abuse Program  1-800-659-3381   °Psychological Services °Organization         Address  Phone  Notes  °[facility]  336- 832-9600   °[facility]  336- 378-7881   °[facility] [address], [place] 1-800-853-5163 or 336-641-4981   ° °Mobile Crisis Teams °Organization         Address  Phone  Notes  °[facility], Mobile Crisis Care Unit  1-877-626-1772   °Assertive °[facility] ° [address] [place], [place] 336-834-9664   °Sharon DeEsch [address] °[place] 336-554-5454   ° °Self-Help/Support Groups °Organization         Address  Phone             Notes  °[facility] - variety of support groups  336- 373-1402 Call for more information  °Narcotics Anonymous (NA), [facility] [address], °[place]  2 meetings at this location  ° °Residential Treatment Programs °Organization         Address  Phone  Notes  °[facility] [address],    °[place]  1-866-801-8205   °[facility] ° [address], [place], [place] 704-293-8524   °[facility] [address], [place] 336-845-3988 Admissions: 8am-3pm M-F  °[facility] [address]    °[place], [place] 336-841-1104   °[facility] [address], [place], [place] 336-379-7146   °[facility] [address] [place] [address]  °, [place] 336-285-9073   °[facility] - Intensive Outpatient [address], , [place] 336-852-3033   °[facility] [address]  °[place],  1-877-615-2722 or 336-784-9470   °  [facility] [address] [place], [place] 336-227-7417 Accepts Medicaid  °[facility] [address]  °[place] 1-800-659-3381 Substance Abuse/Addiction Treatment  ° °[place] Behavioral Health Resources °Organization         Address  Phone  Notes  °[facility]  (888) 581-9988   °Julie Brannon, PhD [address] [place], [place]   (336) 349-5553 or (336) 951-0000   °[facility]   [address] °[place], [place] (336) 349-4454   °[facility] [address], [place], [place] (336) 342-8316 Insurance/Medicaid/sponsorship through [facility]  °[facility] [address]                                    [place], [place] (336) 342-8316 Therapy/tele-psych/case  °[facility] [address]  ° [place], [place] (336) 349-2233    °Dr. Arfeen  (336) 349-4544   °[facility] [place]  [employer] [place] [facility] 1) [address], [place] °2) [address], [place] °3)  [address], [place] (336) 349-3220 °(336) 342-7768 ° °(336) 342-8140   °[place] [facility] (336) 342-1394 or (336) 342-3537 (After Hours)    ° ° ° °

## 2015-05-17 ENCOUNTER — Emergency Department (HOSPITAL_COMMUNITY)
Admission: EM | Admit: 2015-05-17 | Discharge: 2015-05-17 | Disposition: A | Discharge status: Home / Self Care | Payer: Medicaid Other | Attending: Emergency Medicine | Admitting: Emergency Medicine

## 2015-05-17 ENCOUNTER — Encounter (HOSPITAL_COMMUNITY): Payer: Self-pay | Admitting: Emergency Medicine

## 2015-05-17 ENCOUNTER — Emergency Department (HOSPITAL_COMMUNITY): Payer: Medicaid Other

## 2015-05-17 DIAGNOSIS — R110 Nausea: Secondary | ICD-10-CM | POA: Diagnosis not present

## 2015-05-17 DIAGNOSIS — E119 Type 2 diabetes mellitus without complications: Secondary | ICD-10-CM | POA: Diagnosis not present

## 2015-05-17 DIAGNOSIS — Z7984 Long term (current) use of oral hypoglycemic drugs: Secondary | ICD-10-CM | POA: Insufficient documentation

## 2015-05-17 DIAGNOSIS — J209 Acute bronchitis, unspecified: Secondary | ICD-10-CM | POA: Insufficient documentation

## 2015-05-17 DIAGNOSIS — I10 Essential (primary) hypertension: Secondary | ICD-10-CM | POA: Insufficient documentation

## 2015-05-17 DIAGNOSIS — R5383 Other fatigue: Secondary | ICD-10-CM | POA: Diagnosis not present

## 2015-05-17 DIAGNOSIS — Z79899 Other long term (current) drug therapy: Secondary | ICD-10-CM | POA: Insufficient documentation

## 2015-05-17 DIAGNOSIS — R05 Cough: Secondary | ICD-10-CM | POA: Diagnosis present

## 2015-05-17 DIAGNOSIS — F17210 Nicotine dependence, cigarettes, uncomplicated: Secondary | ICD-10-CM | POA: Diagnosis not present

## 2015-05-17 MED ORDER — ONDANSETRON 8 MG PO TBDP
8.0000 mg | ORAL_TABLET | Freq: Once | ORAL | Status: AC
  Administered 2015-05-17: 8 mg via ORAL
  Filled 2015-05-17: qty 1

## 2015-05-17 MED ORDER — ALBUTEROL SULFATE HFA 108 (90 BASE) MCG/ACT IN AERS
2.0000 | INHALATION_SPRAY | Freq: Once | RESPIRATORY_TRACT | Status: AC
  Administered 2015-05-17: 2 via RESPIRATORY_TRACT
  Filled 2015-05-17: qty 6.7

## 2015-05-17 MED ORDER — ONDANSETRON 8 MG PO TBDP: 8.0000 mg | ORAL_TABLET | Freq: Three times a day (TID) | ORAL | Status: DC | PRN

## 2015-05-17 MED ORDER — BENZONATATE 100 MG PO CAPS
200.0000 mg | ORAL_CAPSULE | Freq: Once | ORAL | Status: AC
  Administered 2015-05-17: 200 mg via ORAL
  Filled 2015-05-17: qty 2

## 2015-05-17 MED ORDER — IPRATROPIUM-ALBUTEROL 0.5-2.5 (3) MG/3ML IN SOLN
3.0000 mL | Freq: Once | RESPIRATORY_TRACT | Status: AC
  Administered 2015-05-17: 3 mL via RESPIRATORY_TRACT
  Filled 2015-05-17: qty 3

## 2015-05-17 MED ORDER — BENZONATATE 100 MG PO CAPS: 200.0000 mg | ORAL_CAPSULE | Freq: Three times a day (TID) | ORAL | Status: DC | PRN

## 2015-05-17 MED ORDER — ALBUTEROL SULFATE (2.5 MG/3ML) 0.083% IN NEBU
2.5000 mg | INHALATION_SOLUTION | Freq: Once | RESPIRATORY_TRACT | Status: AC
  Administered 2015-05-17: 2.5 mg via RESPIRATORY_TRACT
  Filled 2015-05-17: qty 3

## 2015-05-17 NOTE — ED Notes (Signed)
Pt states has had a cold for the past 2 days. Today been having nausea, no vomiting.

## 2015-05-17 NOTE — ED Notes (Signed)
Pt alert & oriented x4, stable gait. Patient given discharge instructions, paperwork & prescription(s). Patient  instructed to stop at the registration desk to finish any additional paperwork. Patient verbalized understanding. Pt left department w/ no further questions. 

## 2015-05-17 NOTE — ED Notes (Signed)
Pt states that he has been nauseated all day with nasal congestion.  States that he cannot eat due to nausea, but denies vomiting or abdominal pain.

## 2015-05-17 NOTE — Discharge Instructions (Signed)

## 2015-05-19 NOTE — ED Provider Notes (Signed)
CSN: [id number]     Arrival date & time 05/17/15  1824 History   First MD Initiated Contact with Patient 05/17/15 1843     Chief Complaint  Patient presents with  . Nausea     (Consider location/radiation/quality/duration/timing/severity/associated sxs/prior Treatment) The history is provided by the patient.   Brandon Esparza is a 30 y.o. male presenting with a 2 day history of cold like symptoms which includes nasal congestion with clear rhinorrhea, copious post nasal drip, nonproductive cough and now nausea which started today.  Symptoms due to not include fever, shortness of breath, wheezing, chest pain,  Abdominal pain, vomiting or diarrhea.  He endorses increased fatigue which is partially due to a new infant at home, has been getting disrupted sleep.   The patient has taken no medicines prior to arrival.  He endorses good blood glucose control.     Past Medical History  Diagnosis Date  . Diabetes mellitus without complication (HCC)   . Hypertension    Past Surgical History  Procedure Laterality Date  . Tonsillectomy    . Appendectomy    . Adenoidectomy     History reviewed. No pertinent family history. Social History  Substance Use Topics  . Smoking status: Current Every Day Smoker -- 0.50 packs/day    Types: Cigarettes  . Smokeless tobacco: Never Used  . Alcohol Use: No    Review of Systems  Constitutional: Positive for fatigue. Negative for fever and chills.  HENT: Positive for congestion, postnasal drip and rhinorrhea. Negative for ear discharge, ear pain, sinus pressure, sore throat, trouble swallowing and voice change.   Eyes: Negative for discharge.  Respiratory: Positive for cough. Negative for shortness of breath, wheezing and stridor.   Cardiovascular: Negative for chest pain.  Gastrointestinal: Negative for abdominal pain.  Genitourinary: Negative.       Allergies  Review of patient's allergies indicates no known allergies.  Home Medications    Prior to Admission medications   Medication Sig Start Date End Date Taking? Authorizing Provider  lisinopril-hydrochlorothiazide (PRINZIDE,ZESTORETIC) 20-12.5 MG per tablet Take 1 tablet by mouth daily.   Yes Historical Provider, MD  metFORMIN (GLUCOPHAGE) 500 MG tablet Take 500 mg by mouth 2 (two) times daily with a meal.   Yes Historical Provider, MD  benzonatate (TESSALON) 100 MG capsule Take 2 capsules (200 mg total) by mouth 3 (three) times daily as needed. 05/17/15   Burgess AmorJulie Franki Alcaide, PA-C  cephALEXin (KEFLEX) 500 MG capsule Take 1 capsule (500 mg total) by mouth 4 (four) times daily. Patient not taking: Reported on 05/17/2015 03/28/15   Raeford RazorStephen Kohut, MD  HYDROcodone-acetaminophen (NORCO/VICODIN) 5-325 MG tablet Take 2 tablets by mouth every 4 (four) hours as needed for moderate pain. Patient not taking: Reported on 03/28/2015 03/10/15   Gilda Creasehristopher J Pollina, MD  ondansetron (ZOFRAN ODT) 8 MG disintegrating tablet Take 1 tablet (8 mg total) by mouth every 8 (eight) hours as needed for nausea or vomiting. 05/17/15   Burgess AmorJulie Jannet Calip, PA-C  traMADol (ULTRAM) 50 MG tablet Take 1 tablet (50 mg total) by mouth every 6 (six) hours as needed. Patient not taking: Reported on 03/28/2015 03/09/15   Gilda Creasehristopher J Pollina, MD  traMADol (ULTRAM) 50 MG tablet Take 1 tablet (50 mg total) by mouth every 6 (six) hours as needed. Patient not taking: Reported on 05/17/2015 03/28/15   Raeford RazorStephen Kohut, MD   BP 149/92 mmHg  Pulse 100  Temp(Src) 98.8 F (37.1 C) (Oral)  Resp 18  Ht 6\' 3"  (1.905 m)  Wt 158.759 kg  BMI 43.75 kg/m2  SpO2 99% Physical Exam  Constitutional: He is oriented to person, place, and time. He appears well-developed and well-nourished.  HENT:  Head: Normocephalic and atraumatic.  Right Ear: Tympanic membrane and ear canal normal.  Left Ear: Tympanic membrane and ear canal normal.  Nose: Mucosal edema and rhinorrhea present.  Mouth/Throat: Uvula is midline, oropharynx is clear and moist and  mucous membranes are normal. No oropharyngeal exudate, posterior oropharyngeal edema, posterior oropharyngeal erythema or tonsillar abscesses.  Eyes: Conjunctivae are normal.  Cardiovascular: Normal rate and normal heart sounds.   Pulmonary/Chest: Effort normal. No respiratory distress. He has wheezes. He has no rales.  Mild expiratory wheeze which clears with cough.  Otherwise normal aeration, good air movement, good effort.  Abdominal: Soft. There is no tenderness.  Musculoskeletal: Normal range of motion.  Neurological: He is alert and oriented to person, place, and time.  Skin: Skin is warm and dry. No rash noted.  Psychiatric: He has a normal mood and affect.    ED Course  Procedures (including critical care time) Labs Review Labs Reviewed - No data to display  Imaging Review Dg Chest 2 View  05/17/2015  CLINICAL DATA:  Productive cough and wheezing for 2 days. EXAM: CHEST  2 VIEW COMPARISON:  March 12, 2013. FINDINGS: The heart size and mediastinal contours are within normal limits. Both lungs are clear. No pneumothorax or pleural effusion is noted. The visualized skeletal structures are unremarkable. IMPRESSION: No active cardiopulmonary disease. Electronically Signed   By: Lupita Raider, M.D.   On: 05/17/2015 20:01   I have personally reviewed and evaluated these images and lab results as part of my medical decision-making.   EKG Interpretation None      MDM   Final diagnoses:  Acute bronchitis, unspecified organism    Pt with uri sx and wheezing, no concerning findings on cxr.  Pt was albuterol mdi with resolution of wheezing.  Given for home use prn. Also prescribed tessalon for cough, zofran for nausea which I suspect is from post nasal drip.  Discussed coricidin brand for nasal congestion and drainage, discussed initial elevated bp, improved at dc.  Tolerated PO fluids prior to dc home. He is taking his bp med daily. Advised f/u with pcp if sx persist or worsen, here  for any worsened sx.  The patient appears reasonably screened and/or stabilized for discharge and I doubt any other medical condition or other [facility] requiring further screening, evaluation, or treatment in the ED at this time prior to discharge.     Burgess Amor, PA-C 05/19/15 1353  Samuel Jester, DO 05/21/15 [phone]

## 2016-12-06 IMAGING — DX DG CHEST 2V
2 series · 2 of 2 positions shown · non-contrast
Comparison: March 12, 2013.

CLINICAL DATA: Productive cough and wheezing for 2 days.

EXAM:
CHEST  2 VIEW

[chest pa]
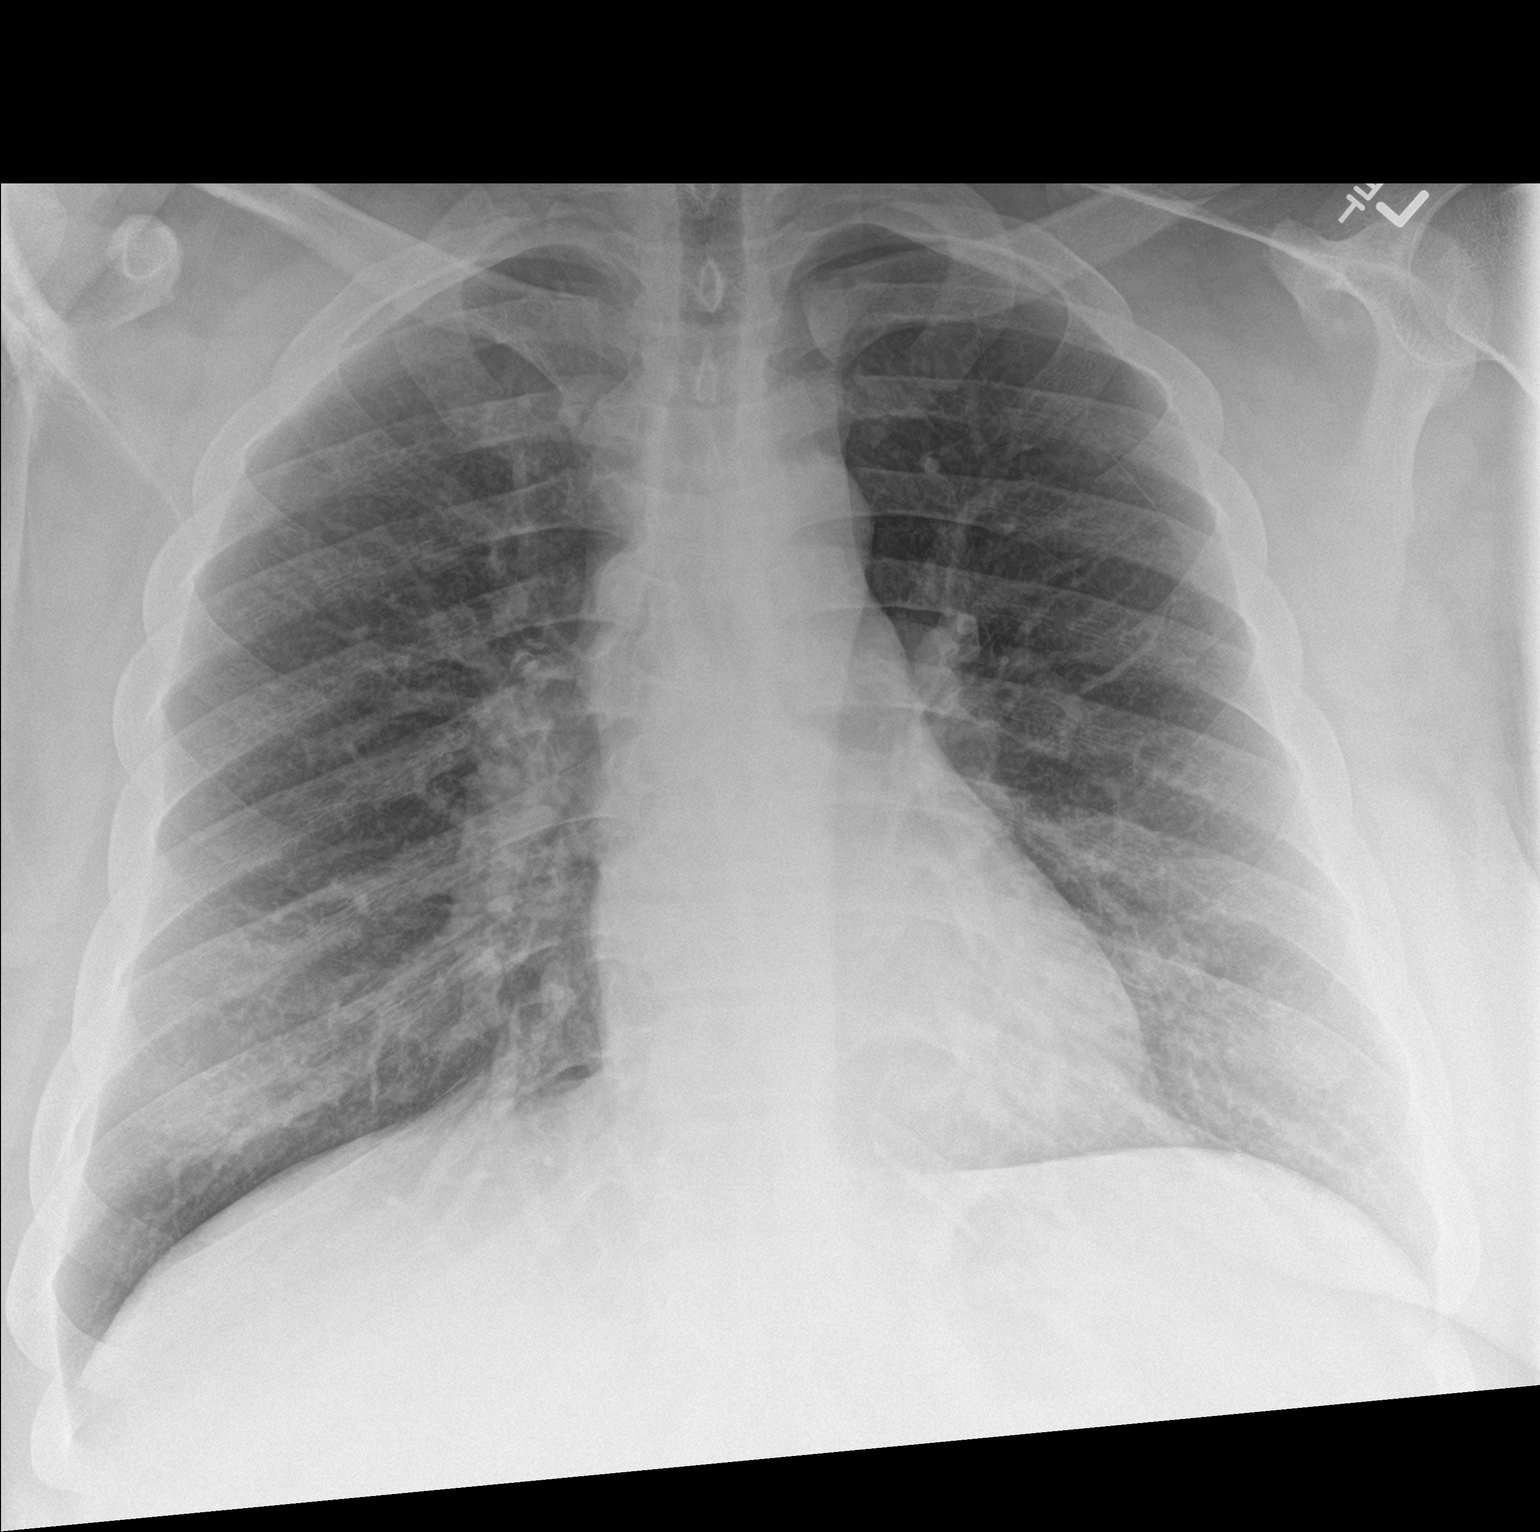

[chest lat]
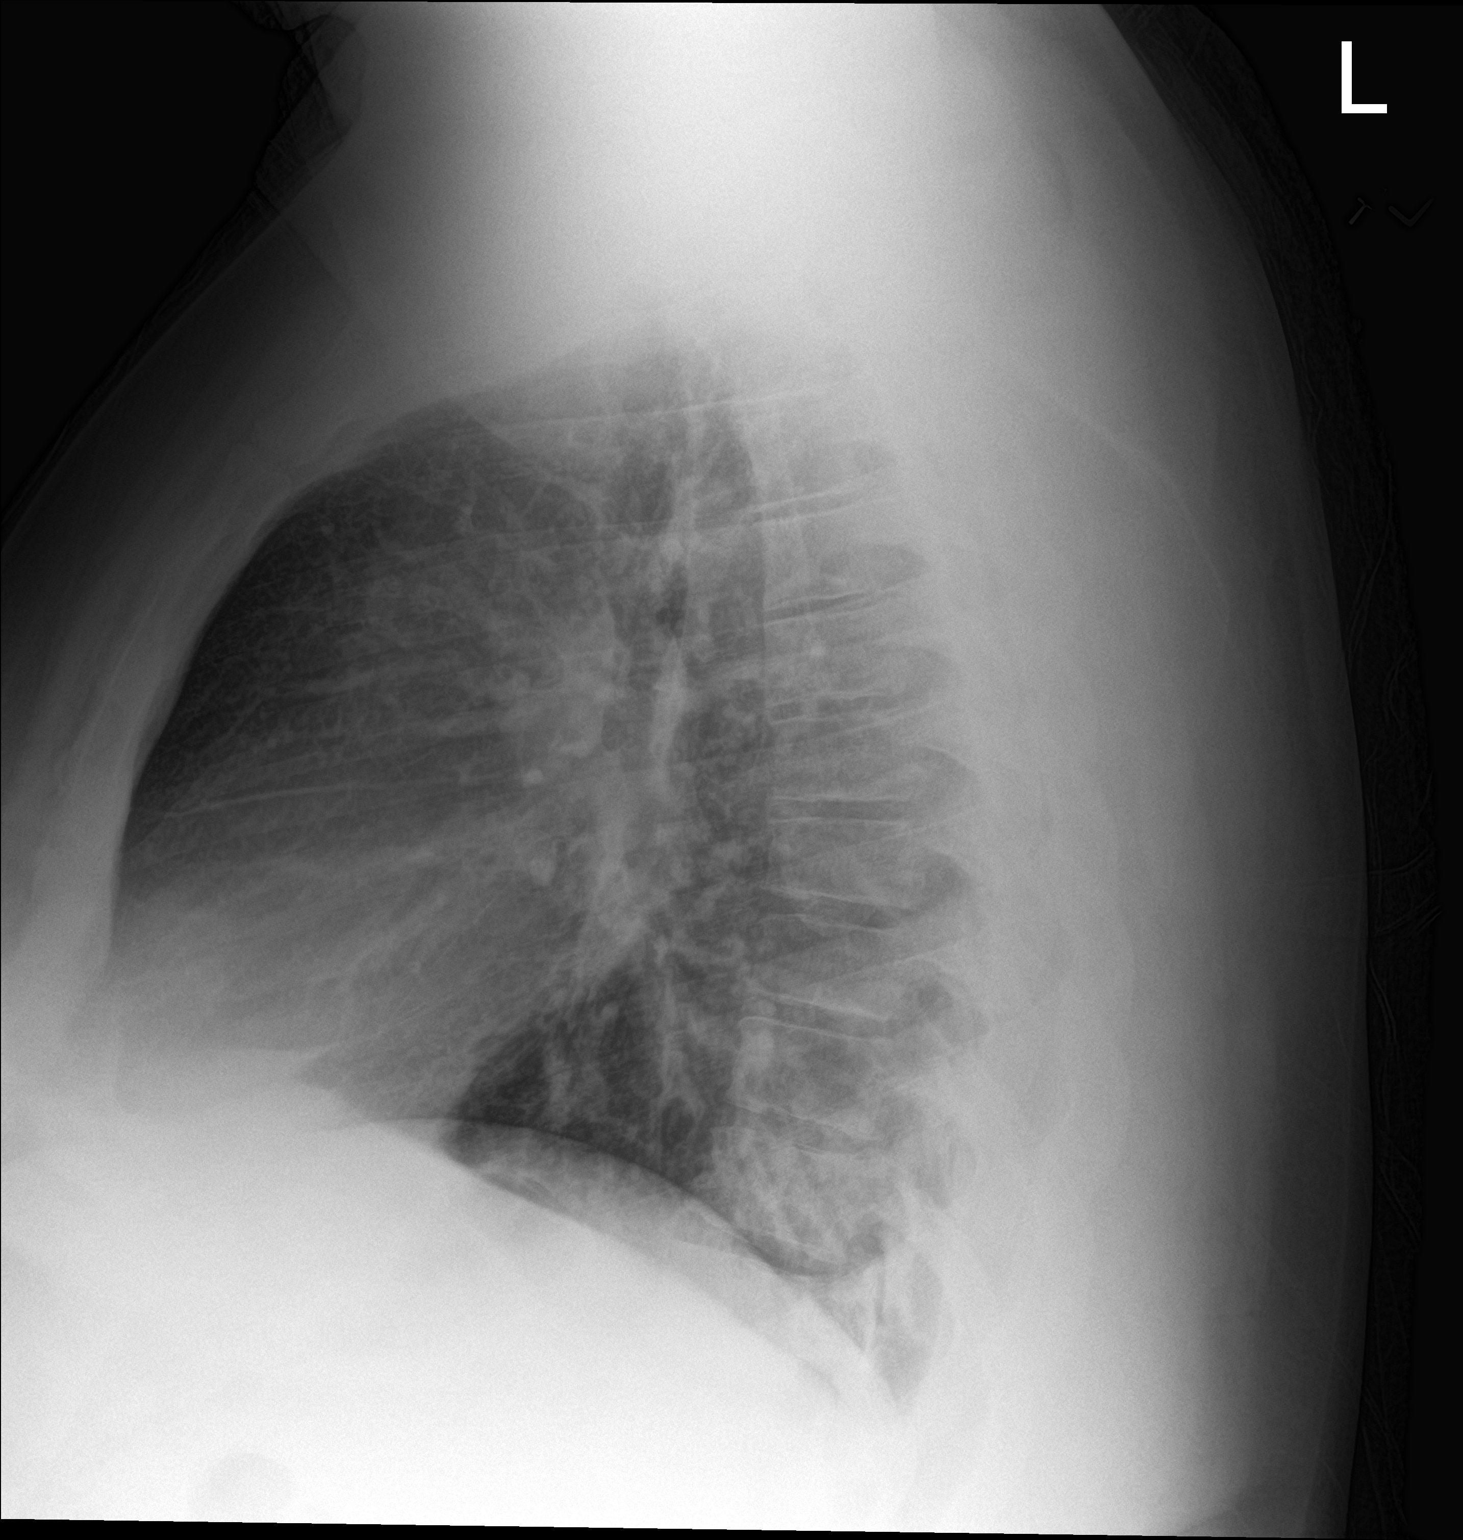

[2 of 2 positions shown; findings below may reference images not displayed]

FINDINGS: The heart size and mediastinal contours are within normal limits.
Both lungs are clear. No pneumothorax or pleural effusion is noted.
The visualized skeletal structures are unremarkable.
IMPRESSION: No active cardiopulmonary disease.

## 2019-04-30 ENCOUNTER — Other Ambulatory Visit: Payer: Self-pay

## 2019-05-04 ENCOUNTER — Ambulatory Visit: Payer: Medicaid Other | Attending: Internal Medicine

## 2019-05-04 ENCOUNTER — Other Ambulatory Visit: Payer: Self-pay

## 2019-05-04 DIAGNOSIS — Z20822 Contact with and (suspected) exposure to covid-19: Secondary | ICD-10-CM

## 2019-05-05 LAB — NOVEL CORONAVIRUS, NAA: SARS-CoV-2, NAA: DETECTED — AB

## 2019-05-06 ENCOUNTER — Ambulatory Visit: Payer: Self-pay | Admitting: *Deleted

## 2019-05-06 ENCOUNTER — Telehealth: Payer: Self-pay | Admitting: Nurse Practitioner

## 2019-05-06 NOTE — Telephone Encounter (Signed)
Called to Discuss with patient about Covid symptoms and the use of bamlanivimab, a monoclonal antibody infusion for those with mild to moderate Covid symptoms and at a high risk of hospitalization.     Pt is qualified for this infusion at the [facility] infusion center due to co-morbid conditions and/or a member of an at-risk group.       Patient declines infusion at this time. Symptoms tier reviewed as well as criteria for ending isolation. Preventative practices reviewed. Patient verbalized understanding.    Patient advised to call back if he decides that he does want to get infusion. Callback number to the infusion center given. Patient advised to go to Urgent care or ED with severe symptoms.    Patient states that symptoms started on 05/03/19

## 2019-05-06 NOTE — Telephone Encounter (Signed)
Summary: covid result    Pt called for covid results/ please advise      Call to patient- advised of results- see lab note for details.  Reason for Disposition . Caller requesting lab results  Protocols used: PCP CALL - NO TRIAGE-A-AH

## 2019-05-12 ENCOUNTER — Other Ambulatory Visit: Payer: Self-pay

## 2019-05-12 ENCOUNTER — Ambulatory Visit: Payer: Medicaid Other | Attending: Internal Medicine

## 2019-05-12 DIAGNOSIS — Z20822 Contact with and (suspected) exposure to covid-19: Secondary | ICD-10-CM

## 2019-05-15 ENCOUNTER — Telehealth: Payer: Self-pay

## 2019-05-15 LAB — NOVEL CORONAVIRUS, NAA: SARS-CoV-2, NAA: NOT DETECTED

## 2019-05-15 NOTE — Telephone Encounter (Signed)
Pt call to give fax number to have covid test results send to employer. Sent request to nurse triage.   Brandon Esparza

## 2021-05-10 ENCOUNTER — Other Ambulatory Visit: Payer: Self-pay

## 2021-05-10 ENCOUNTER — Emergency Department (HOSPITAL_COMMUNITY)
Admission: EM | Admit: 2021-05-10 | Discharge: 2021-05-11 | Disposition: A | Discharge status: Home / Self Care | Payer: Medicaid Other | Attending: Emergency Medicine | Admitting: Emergency Medicine

## 2021-05-10 DIAGNOSIS — U071 COVID-19: Secondary | ICD-10-CM | POA: Insufficient documentation

## 2021-05-10 DIAGNOSIS — I10 Essential (primary) hypertension: Secondary | ICD-10-CM | POA: Insufficient documentation

## 2021-05-10 DIAGNOSIS — Z5321 Procedure and treatment not carried out due to patient leaving prior to being seen by health care provider: Secondary | ICD-10-CM | POA: Insufficient documentation

## 2021-05-10 LAB — RESP PANEL BY RT-PCR (FLU A&B, COVID) ARPGX2
Influenza A by PCR: NEGATIVE
Influenza B by PCR: NEGATIVE
SARS Coronavirus 2 by RT PCR: POSITIVE — AB

## 2021-05-10 NOTE — ED Notes (Signed)
Pt called to go back to treatment room. No answer.

## 2021-05-10 NOTE — ED Triage Notes (Addendum)
Pt arrives with c/o headache, chills, and generalized body aches that started this evening. Pt has hx of HTN, but is not taking his prescribed meds for it.

## 2021-05-11 ENCOUNTER — Other Ambulatory Visit: Payer: Self-pay

## 2021-05-11 ENCOUNTER — Encounter (HOSPITAL_COMMUNITY): Payer: Self-pay

## 2021-05-11 ENCOUNTER — Emergency Department (HOSPITAL_COMMUNITY)
Admission: EM | Admit: 2021-05-11 | Discharge: 2021-05-11 | Disposition: A | Discharge status: Home / Self Care | Payer: Medicaid Other | Attending: Emergency Medicine | Admitting: Emergency Medicine

## 2021-05-11 DIAGNOSIS — E119 Type 2 diabetes mellitus without complications: Secondary | ICD-10-CM | POA: Insufficient documentation

## 2021-05-11 DIAGNOSIS — Z79899 Other long term (current) drug therapy: Secondary | ICD-10-CM | POA: Insufficient documentation

## 2021-05-11 DIAGNOSIS — I10 Essential (primary) hypertension: Secondary | ICD-10-CM | POA: Insufficient documentation

## 2021-05-11 DIAGNOSIS — U071 COVID-19: Secondary | ICD-10-CM | POA: Insufficient documentation

## 2021-05-11 DIAGNOSIS — Z7984 Long term (current) use of oral hypoglycemic drugs: Secondary | ICD-10-CM | POA: Insufficient documentation

## 2021-05-11 NOTE — ED Provider Notes (Signed)
[facility] Provider Note   CSN: [id number] Arrival date & time: 05/11/21  0636     History  No chief complaint on file.   Brandon Esparza is a 37 y.o. male.  He has a history of hypertension and diabetes but does not take any medicine.  Does not have a doctor.  Complaining of headache body aches chills that started 2 days ago.  No vomiting or diarrhea.  Was here yesterday and was swabbed positive for COVID but did not stay for evaluation.  Back again this morning for same.  Using Tylenol without any improvement.  The history is provided by the patient.  Influenza Presenting symptoms: cough, fatigue, headache and myalgias   Presenting symptoms: no diarrhea, no fever, no shortness of breath, no sore throat and no vomiting   Severity:  Moderate Onset quality:  Gradual Duration:  2 days Progression:  Worsening Chronicity:  New Relieved by:  Nothing Worsened by:  Movement Ineffective treatments:  OTC medications Associated symptoms: chills   Associated symptoms: no mental status change and no syncope   Risk factors: no immunocompromised state       Home Medications Prior to Admission medications   Medication Sig Start Date End Date Taking? Authorizing Provider  benzonatate (TESSALON) 100 MG capsule Take 2 capsules (200 mg total) by mouth 3 (three) times daily as needed. 05/17/15   Evalee Jefferson, PA-C  cephALEXin (KEFLEX) 500 MG capsule Take 1 capsule (500 mg total) by mouth 4 (four) times daily. Patient not taking: Reported on 05/17/2015 03/28/15   Virgel Manifold, MD  HYDROcodone-acetaminophen (NORCO/VICODIN) 5-325 MG tablet Take 2 tablets by mouth every 4 (four) hours as needed for moderate pain. Patient not taking: Reported on 03/28/2015 03/10/15   Orpah Greek, MD  lisinopril-hydrochlorothiazide (PRINZIDE,ZESTORETIC) 20-12.5 MG per tablet Take 1 tablet by mouth daily.    [provider]  metFORMIN (GLUCOPHAGE) 500 MG tablet Take 500 mg by  mouth 2 (two) times daily with a meal.    [provider]  ondansetron (ZOFRAN ODT) 8 MG disintegrating tablet Take 1 tablet (8 mg total) by mouth every 8 (eight) hours as needed for nausea or vomiting. 05/17/15   Evalee Jefferson, PA-C  traMADol (ULTRAM) 50 MG tablet Take 1 tablet (50 mg total) by mouth every 6 (six) hours as needed. Patient not taking: Reported on 03/28/2015 03/09/15   Orpah Greek, MD  traMADol (ULTRAM) 50 MG tablet Take 1 tablet (50 mg total) by mouth every 6 (six) hours as needed. Patient not taking: Reported on 05/17/2015 03/28/15   Virgel Manifold, MD      Allergies    Patient has no known allergies.    Review of Systems   Review of Systems  Constitutional:  Positive for chills and fatigue. Negative for fever.  HENT:  Negative for sore throat.   Eyes:  Negative for visual disturbance.  Respiratory:  Positive for cough. Negative for shortness of breath.   Cardiovascular:  Negative for chest pain.  Gastrointestinal:  Negative for abdominal pain, diarrhea and vomiting.  Genitourinary:  Negative for dysuria.  Musculoskeletal:  Positive for myalgias.  Skin:  Negative for rash.  Neurological:  Positive for headaches.   Physical Exam Updated Vital Signs BP (!) 182/98 (BP Location: Right Arm)    Pulse 90    Temp 98.8 F (37.1 C) (Oral)    Resp 18    Ht 6\' 4"  (1.93 m)    Wt (!) 154.2 kg  SpO2 98%    BMI 41.39 kg/m  Physical Exam Vitals and nursing note reviewed.  Constitutional:      General: He is not in acute distress.    Appearance: Normal appearance. He is well-developed. He is obese.  HENT:     Head: Normocephalic and atraumatic.  Eyes:     Conjunctiva/sclera: Conjunctivae normal.  Cardiovascular:     Rate and Rhythm: Normal rate and regular rhythm.     Heart sounds: No murmur heard. Pulmonary:     Effort: Pulmonary effort is normal. No respiratory distress.     Breath sounds: Normal breath sounds.  Abdominal:     Palpations: Abdomen is  soft.     Tenderness: There is no abdominal tenderness. There is no guarding or rebound.  Musculoskeletal:        General: No swelling.     Cervical back: Neck supple.  Skin:    General: Skin is warm and dry.     Capillary Refill: Capillary refill takes less than 2 seconds.  Neurological:     General: No focal deficit present.     Mental Status: He is alert.    ED Results / Procedures / Treatments   Labs (all labs ordered are listed, but only abnormal results are displayed) Labs Reviewed  CBG MONITORING, ED    EKG None  Radiology No results found.  Procedures Procedures    Medications Ordered in ED Medications - No data to display  ED Course/ Medical Decision Making/ A&P                           Medical Decision Making Brandon Esparza was evaluated in Emergency Department on 05/11/2021 for the symptoms described in the history of present illness. He was evaluated in the context of the global COVID-19 pandemic, which necessitated consideration that the patient might be at risk for infection with the SARS-CoV-2 virus that causes COVID-19. Institutional protocols and algorithms that pertain to the evaluation of patients at risk for COVID-19 are in a state of rapid change based on information released by regulatory bodies including the CDC and federal and state organizations. These policies and algorithms were followed during the patient's care in the ED.          Final Clinical Impression(s) / ED Diagnoses Final diagnoses:  COVID-19 virus infection  Primary hypertension    Rx / DC Orders ED Discharge Orders     None         Hayden Rasmussen, MD 05/11/21 1910

## 2021-05-11 NOTE — ED Triage Notes (Addendum)
Flu like symptoms was here last night and didn't want to wait.  Went home took some tylenol and didn't feel any better.  Headache and body aches (COVID +) per yesterday test

## 2021-05-11 NOTE — ED Notes (Signed)
CBG 79 

## 2021-05-11 NOTE — Discharge Instructions (Signed)
You are seen in the emergency department for body aches headache chills.  You tested positive for COVID.  Please drink plenty of fluids rest Tylenol and ibuprofen.  Return if any shortness of breath or other concerning symptoms.  He should isolate for at least 5 days from the beginning of your symptoms.

## 2021-05-15 ENCOUNTER — Other Ambulatory Visit: Payer: Self-pay

## 2021-05-15 ENCOUNTER — Ambulatory Visit
Admission: EM | Admit: 2021-05-15 | Discharge: 2021-05-15 | Disposition: A | Discharge status: Home / Self Care | Payer: Medicaid Other | Attending: Family Medicine | Admitting: Family Medicine

## 2021-05-15 DIAGNOSIS — I10 Essential (primary) hypertension: Secondary | ICD-10-CM

## 2021-05-15 DIAGNOSIS — U071 COVID-19: Secondary | ICD-10-CM

## 2021-05-15 MED ORDER — AMLODIPINE BESYLATE 5 MG PO TABS: 5.0000 mg | ORAL_TABLET | Freq: Every day | ORAL | 0 refills | Status: DC

## 2021-05-15 NOTE — ED Triage Notes (Signed)
Pt recently had covid and needs return to work note , denies any symptoms

## 2021-05-15 NOTE — ED Provider Notes (Signed)
[facility]    CSN: [id number] Arrival date & time: 05/15/21  1345      History   Chief Complaint Chief Complaint  Patient presents with   Letter for School/Work    HPI Brandon Esparza is a 36 y.o. male.   Patient presenting today requesting clearance back to work after having COVID-19.  States he was diagnosed on 05/11/2021 in the emergency department and that his symptoms started 2 days prior.  These notes are available in the electronic medical record from the emergency department.  He states his symptoms have completely resolved and he is feeling in his usual state of health at this time and ready to return back to work immediately.  He is also requesting refills on his blood pressure medication, he states he has been out for several years now and would like to get back on it while he looks for a primary care provider.  Denies chest pain, shortness of breath, dizziness.   Past Medical History:  Diagnosis Date   Diabetes mellitus without complication (HCC)    Hypertension     There are no problems to display for this patient.   Past Surgical History:  Procedure Laterality Date   ADENOIDECTOMY     APPENDECTOMY     TONSILLECTOMY         Home Medications    Prior to Admission medications   Medication Sig Start Date End Date Taking? Authorizing Provider  amLODipine (NORVASC) 5 MG tablet Take 1 tablet (5 mg total) by mouth daily. 05/15/21  Yes Particia NearingLane, Cimone Fahey Elizabeth, PA-C  benzonatate (TESSALON) 100 MG capsule Take 2 capsules (200 mg total) by mouth 3 (three) times daily as needed. 05/17/15   Burgess AmorIdol, Julie, PA-C  cephALEXin (KEFLEX) 500 MG capsule Take 1 capsule (500 mg total) by mouth 4 (four) times daily. Patient not taking: Reported on 05/17/2015 03/28/15   Raeford RazorKohut, Stephen, MD  HYDROcodone-acetaminophen (NORCO/VICODIN) 5-325 MG tablet Take 2 tablets by mouth every 4 (four) hours as needed for moderate pain. Patient not taking: Reported on 03/28/2015 03/10/15    Gilda CreasePollina, Christopher J, MD  lisinopril-hydrochlorothiazide (PRINZIDE,ZESTORETIC) 20-12.5 MG per tablet Take 1 tablet by mouth daily.    [provider]  metFORMIN (GLUCOPHAGE) 500 MG tablet Take 500 mg by mouth 2 (two) times daily with a meal.    [provider]  ondansetron (ZOFRAN ODT) 8 MG disintegrating tablet Take 1 tablet (8 mg total) by mouth every 8 (eight) hours as needed for nausea or vomiting. 05/17/15   Burgess AmorIdol, Julie, PA-C  traMADol (ULTRAM) 50 MG tablet Take 1 tablet (50 mg total) by mouth every 6 (six) hours as needed. Patient not taking: Reported on 03/28/2015 03/09/15   Gilda CreasePollina, Christopher J, MD  traMADol (ULTRAM) 50 MG tablet Take 1 tablet (50 mg total) by mouth every 6 (six) hours as needed. Patient not taking: Reported on 05/17/2015 03/28/15   Raeford RazorKohut, Stephen, MD    Family History History reviewed. No pertinent family history.  Social History Social History   Tobacco Use   Smoking status: Every Day    Packs/day: 0.50    Types: Cigarettes   Smokeless tobacco: Never  Substance Use Topics   Alcohol use: No   Drug use: No     Allergies   Patient has no known allergies.   Review of Systems Review of Systems Per HPI  Physical Exam Triage Vital Signs ED Triage Vitals  Enc Vitals Group     BP 05/15/21 1358 (!) 160/110  Pulse Rate 05/15/21 1358 78     Resp 05/15/21 1358 20     Temp 05/15/21 1358 98.6 F (37 C)     Temp src --      SpO2 05/15/21 1358 96 %     Weight --      Height --      Head Circumference --      Peak Flow --      Pain Score 05/15/21 1357 0     Pain Loc --      Pain Edu? --      Excl. in GC? --    No data found.  Updated Vital Signs BP (!) 160/110    Pulse 78    Temp 98.6 F (37 C)    Resp 20    SpO2 96%   Visual Acuity Right Eye Distance:   Left Eye Distance:   Bilateral Distance:    Right Eye Near:   Left Eye Near:    Bilateral Near:     Physical Exam Vitals and nursing note reviewed.   Constitutional:      Appearance: Normal appearance.  HENT:     Head: Atraumatic.     Nose: Nose normal.     Mouth/Throat:     Mouth: Mucous membranes are moist.  Eyes:     Extraocular Movements: Extraocular movements intact.     Conjunctiva/sclera: Conjunctivae normal.  Cardiovascular:     Rate and Rhythm: Normal rate and regular rhythm.  Pulmonary:     Effort: Pulmonary effort is normal.     Breath sounds: Normal breath sounds.  Musculoskeletal:        General: Normal range of motion.     Cervical back: Normal range of motion and neck supple.  Skin:    General: Skin is warm and dry.  Neurological:     General: No focal deficit present.     Mental Status: He is oriented to person, place, and time.  Psychiatric:        Mood and Affect: Mood normal.        Thought Content: Thought content normal.        Judgment: Judgment normal.     UC Treatments / Results  Labs (all labs ordered are listed, but only abnormal results are displayed) Labs Reviewed - No data to display  EKG   Radiology No results found.  Procedures Procedures (including critical care time)  Medications Ordered in UC Medications - No data to display  Initial Impression / Assessment and Plan / UC Course  I have reviewed the triage vital signs and the nursing notes.  Pertinent labs & imaging results that were available during my care of the patient were reviewed by me and considered in my medical decision making (see chart for details).     Hypertensive in triage, otherwise vital signs reassuring.  Appears resolved from COVID-19 infection 6 days ago so we will clear back to work.  We will also start amlodipine while awaiting PCP visit.  Discussed monitoring home blood pressures, low-sodium diet, exercise, weight loss.  Return for worsening symptoms or for blood pressure recheck if unable to find a primary care provider soon.  Final Clinical Impressions(s) / UC Diagnoses   Final diagnoses:   COVID-19  Essential hypertension     Discharge Instructions      Establish care with primary care soon as possible to recheck you blood pressures and manage your medication.    ED Prescriptions  Medication Sig Dispense Auth. Provider   amLODipine (NORVASC) 5 MG tablet Take 1 tablet (5 mg total) by mouth daily. 90 tablet Particia Nearing, [place]      PDMP not reviewed this encounter.   Particia Nearing, [place] 05/15/21 1438

## 2021-05-15 NOTE — Discharge Instructions (Signed)
Establish care with primary care soon as possible to recheck you blood pressures and manage your medication.

## 2021-05-15 NOTE — ED Triage Notes (Signed)
Pt states he has been out of BP medication for a while

## 2022-03-13 ENCOUNTER — Other Ambulatory Visit: Payer: Self-pay

## 2022-03-13 ENCOUNTER — Encounter (HOSPITAL_COMMUNITY): Payer: Self-pay

## 2022-03-13 ENCOUNTER — Emergency Department (HOSPITAL_COMMUNITY)
Admission: EM | Admit: 2022-03-13 | Discharge: 2022-03-13 | Disposition: A | Discharge status: Home / Self Care | Payer: Managed Care, Other (non HMO) | Attending: Student | Admitting: Student

## 2022-03-13 DIAGNOSIS — H209 Unspecified iridocyclitis: Secondary | ICD-10-CM | POA: Insufficient documentation

## 2022-03-13 DIAGNOSIS — Z79899 Other long term (current) drug therapy: Secondary | ICD-10-CM | POA: Insufficient documentation

## 2022-03-13 DIAGNOSIS — I10 Essential (primary) hypertension: Secondary | ICD-10-CM | POA: Insufficient documentation

## 2022-03-13 MED ORDER — TETRACAINE HCL 0.5 % OP SOLN
2.0000 [drp] | Freq: Once | OPHTHALMIC | Status: AC
  Administered 2022-03-13: 2 [drp] via OPHTHALMIC
  Filled 2022-03-13: qty 4

## 2022-03-13 MED ORDER — AMLODIPINE BESYLATE 5 MG PO TABS
10.0000 mg | ORAL_TABLET | Freq: Once | ORAL | Status: AC
  Administered 2022-03-13: 10 mg via ORAL
  Filled 2022-03-13: qty 2

## 2022-03-13 MED ORDER — AMLODIPINE BESYLATE 10 MG PO TABS: 10.0000 mg | ORAL_TABLET | Freq: Every day | ORAL | 0 refills | Status: DC

## 2022-03-13 MED ORDER — ATROPINE SULFATE 1 % OP SOLN
1.0000 [drp] | Freq: Once | OPHTHALMIC | Status: AC
  Administered 2022-03-13: 1 [drp] via OPHTHALMIC
  Filled 2022-03-13: qty 2

## 2022-03-13 MED ORDER — FLUORESCEIN SODIUM 1 MG OP STRP
1.0000 | ORAL_STRIP | Freq: Once | OPHTHALMIC | Status: AC
  Administered 2022-03-13: 1 via OPHTHALMIC
  Filled 2022-03-13: qty 1

## 2022-03-13 NOTE — Discharge Instructions (Signed)
You are receiving an eyedrop to take home with you, however you may not need any additional drops in your right eye as what we gave you here will probably keep your eye dilated for at least the next 24 hours, you may apply 1 more drop tomorrow if you start to have increased pain and sensitivity in this right eye again.  Call Dr. Katy Fitch for recheck of your eye within the next 48 hours.  You are being prescribed blood pressure medication since you have run out of your amlodipine.  Is important that you take this medicine daily and that you establish care with a new family doctor to manage this condition for you.  Please call one of the suggested doctors above or reach out to your former doctor in [place] to reestablish care with him.
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
## 2022-03-13 NOTE — ED Provider Notes (Signed)
[facility] EMERGENCY DEPARTMENT Provider Note   CSN: [id number] Arrival date & time: 03/13/22  1053     History  Chief Complaint  Patient presents with   Eye Injury    Brandon Esparza is a 36 y.o. male.  The history is provided by the patient.       Home Medications Prior to Admission medications   Medication Sig Start Date End Date Taking? Authorizing Provider  amLODipine (NORVASC) 10 MG tablet Take 1 tablet (10 mg total) by mouth daily. 03/13/22  Yes Rick Warnick, Raynelle Fanning, PA-C  benzonatate (TESSALON) 100 MG capsule Take 2 capsules (200 mg total) by mouth 3 (three) times daily as needed. 05/17/15   Burgess Amor, PA-C  cephALEXin (KEFLEX) 500 MG capsule Take 1 capsule (500 mg total) by mouth 4 (four) times daily. Patient not taking: Reported on 05/17/2015 03/28/15   Raeford Razor, MD  HYDROcodone-acetaminophen (NORCO/VICODIN) 5-325 MG tablet Take 2 tablets by mouth every 4 (four) hours as needed for moderate pain. Patient not taking: Reported on 03/28/2015 03/10/15   Gilda Crease, MD  lisinopril-hydrochlorothiazide (PRINZIDE,ZESTORETIC) 20-12.5 MG per tablet Take 1 tablet by mouth daily.    [provider]  metFORMIN (GLUCOPHAGE) 500 MG tablet Take 500 mg by mouth 2 (two) times daily with a meal.    [provider]  ondansetron (ZOFRAN ODT) 8 MG disintegrating tablet Take 1 tablet (8 mg total) by mouth every 8 (eight) hours as needed for nausea or vomiting. 05/17/15   Burgess Amor, PA-C  traMADol (ULTRAM) 50 MG tablet Take 1 tablet (50 mg total) by mouth every 6 (six) hours as needed. Patient not taking: Reported on 03/28/2015 03/09/15   Gilda Crease, MD  traMADol (ULTRAM) 50 MG tablet Take 1 tablet (50 mg total) by mouth every 6 (six) hours as needed. Patient not taking: Reported on 05/17/2015 03/28/15   Raeford Razor, MD      Allergies    Patient has no known allergies.    Review of Systems   Review of Systems  Constitutional:  Negative for chills  and fever.  HENT: Negative.    Eyes:  Positive for photophobia, pain and redness.  Respiratory:  Negative for chest tightness and shortness of breath.   Cardiovascular:  Negative for chest pain, palpitations and leg swelling.  Gastrointestinal:  Negative for abdominal pain, nausea and vomiting.  Genitourinary: Negative.   Musculoskeletal:  Negative for arthralgias, joint swelling and neck pain.  Skin: Negative.  Negative for rash and wound.  Neurological:  Negative for dizziness, weakness, light-headedness, numbness and headaches.  Psychiatric/Behavioral: Negative.      Physical Exam Updated Vital Signs BP (!) 197/123 (BP Location: Right Arm)   Pulse 79   Temp 98.3 F (36.8 C) (Oral)   Resp 20   Ht 6\' 4"  (1.93 m)   Wt (!) 140.6 kg   SpO2 97%   BMI 37.73 kg/m  Physical Exam Vitals and nursing note reviewed.  Constitutional:      Appearance: He is well-developed.  HENT:     Head: Normocephalic and atraumatic.  Eyes:     General: Lids are normal.     Extraocular Movements: Extraocular movements intact.     Right eye: Normal extraocular motion.     Left eye: Normal extraocular motion.     Conjunctiva/sclera:     Right eye: Right conjunctiva is injected.     Pupils:     Right eye: Pupil is sluggish. No corneal abrasion or fluorescein uptake.  exam negative.     Slit lamp exam:    Right eye: Anterior chamber quiet. Photophobia present. No corneal flare or foreign body.     Comments: Consensual pain to light right eye when left eye stimulated.  Visual acuity - OS - 20/50,  OD 20/40,  OU 20/30.   Cardiovascular:     Rate and Rhythm: Normal rate and regular rhythm.     Heart sounds: Normal heart sounds.  Pulmonary:     Effort: Pulmonary effort is normal.     Breath sounds: Normal breath sounds. No wheezing.  Abdominal:     General: Bowel sounds are normal.     Palpations: Abdomen is soft.     Tenderness: There is no abdominal tenderness.  Musculoskeletal:         General: Normal range of motion.     Cervical back: Normal range of motion.  Skin:    General: Skin is warm and dry.  Neurological:     Mental Status: He is alert.     ED Results / Procedures / Treatments   Labs (all labs ordered are listed, but only abnormal results are displayed) Labs Reviewed - No data to display  EKG None  Radiology No results found.  Procedures Procedures    Medications Ordered in ED Medications  amLODipine (NORVASC) tablet 10 mg (has no administration in time range)  tetracaine (PONTOCAINE) 0.5 % ophthalmic solution 2 drop (2 drops Left Eye Given by Other 03/13/22 1430)  fluorescein ophthalmic strip 1 strip (1 strip Left Eye Given 03/13/22 1430)  atropine 1 % ophthalmic solution 1 drop (1 drop Right Eye Given 03/13/22 1450)    ED Course/ Medical Decision Making/ A&P                           Medical Decision Making Patient with history and exam findings strongly suggesting a traumatic iritis.  He states he had significant pain when he left work this morning (he works at night).  However since arriving here his pain is improved, he states his pain is worse when he is in bright light.  His exam is positive for consensual light sensitivity in the right eye.  He does not have a corneal abrasion although he does have significant conjunctival inflammation of the right eye.  Visual acuity is reassuring.  He was placed on atropine drops, 2 drops were given here after which his pain and photophobia resolved.  He was advised he will be light sensitive as this medication dilated his pupil and he should use caution and wear sunglasses.  Referral given to ophthalmology for follow-up care.  He also has significant elevated blood pressure here.  He denies headache, chest pain, shortness of breath, no peripheral edema.  He has been on amlodipine in the past but ran out last spring and did not have a primary MD although is motivated to obtain 1 now that he has insurance with  his employer.  He was given recommendations for obtaining a local PCP.  Risk Prescription drug management.           Final Clinical Impression(s) / ED Diagnoses Final diagnoses:  Traumatic iritis  Primary hypertension    Rx / DC Orders ED Discharge Orders          Ordered    amLODipine (NORVASC) 10 MG tablet  Daily        03/13/22 1549  Evalee Jefferson, PA-C 03/13/22 Wiseman, MD 03/14/22 1512

## 2022-03-13 NOTE — ED Notes (Signed)
Pharmacy contacted to bring atropine eye drops

## 2022-05-11 ENCOUNTER — Encounter (HOSPITAL_COMMUNITY): Payer: Self-pay

## 2022-05-11 ENCOUNTER — Other Ambulatory Visit: Payer: Self-pay

## 2022-05-11 ENCOUNTER — Emergency Department (HOSPITAL_COMMUNITY)
Admission: EM | Admit: 2022-05-11 | Discharge: 2022-05-11 | Disposition: A | Discharge status: Home / Self Care | Payer: Managed Care, Other (non HMO) | Attending: Emergency Medicine | Admitting: Emergency Medicine

## 2022-05-11 DIAGNOSIS — Z1152 Encounter for screening for COVID-19: Secondary | ICD-10-CM | POA: Insufficient documentation

## 2022-05-11 DIAGNOSIS — I10 Essential (primary) hypertension: Secondary | ICD-10-CM | POA: Diagnosis not present

## 2022-05-11 DIAGNOSIS — J111 Influenza due to unidentified influenza virus with other respiratory manifestations: Secondary | ICD-10-CM

## 2022-05-11 DIAGNOSIS — J101 Influenza due to other identified influenza virus with other respiratory manifestations: Secondary | ICD-10-CM | POA: Insufficient documentation

## 2022-05-11 DIAGNOSIS — Z79899 Other long term (current) drug therapy: Secondary | ICD-10-CM | POA: Diagnosis not present

## 2022-05-11 DIAGNOSIS — R059 Cough, unspecified: Secondary | ICD-10-CM | POA: Diagnosis present

## 2022-05-11 LAB — RESP PANEL BY RT-PCR (RSV, FLU A&B, COVID)  RVPGX2
Influenza A by PCR: POSITIVE — AB
Influenza B by PCR: NEGATIVE
Resp Syncytial Virus by PCR: NEGATIVE
SARS Coronavirus 2 by RT PCR: NEGATIVE

## 2022-05-11 MED ORDER — METFORMIN HCL 500 MG PO TABS: 500.0000 mg | ORAL_TABLET | Freq: Two times a day (BID) | ORAL | 5 refills | Status: DC

## 2022-05-11 MED ORDER — OSELTAMIVIR PHOSPHATE 75 MG PO CAPS: 75.0000 mg | ORAL_CAPSULE | Freq: Two times a day (BID) | ORAL | 0 refills | Status: DC

## 2022-05-11 MED ORDER — AMLODIPINE BESYLATE 10 MG PO TABS: 10.0000 mg | ORAL_TABLET | Freq: Every day | ORAL | 5 refills | Status: DC

## 2022-05-11 MED ORDER — LISINOPRIL-HYDROCHLOROTHIAZIDE 20-12.5 MG PO TABS: 1.0000 | ORAL_TABLET | Freq: Every day | ORAL | 5 refills | Status: DC

## 2022-05-11 NOTE — ED Notes (Signed)
Dr Betsey Holiday aware of pt BP

## 2022-05-11 NOTE — ED Provider Notes (Signed)
[facility] EMERGENCY DEPARTMENT Provider Note   CSN: [id number] Arrival date & time: 05/11/22  0325     History  Chief Complaint  Patient presents with   Cough    Brandon Esparza is a 37 y.o. male.  Patient presents to the emergency department for evaluation of cough and congestion.  Patient reports that he started coughing yesterday, productive of mucus.  While at work tonight he started feeling poorly, generalized body aches.       Home Medications Prior to Admission medications   Medication Sig Start Date End Date Taking? Authorizing Provider  oseltamivir (TAMIFLU) 75 MG capsule Take 1 capsule (75 mg total) by mouth every 12 (twelve) hours. 05/11/22  Yes Artyom Stencel, Gwenyth Allegra, MD  amLODipine (NORVASC) 10 MG tablet Take 1 tablet (10 mg total) by mouth daily. 05/11/22   Orpah Greek, MD  benzonatate (TESSALON) 100 MG capsule Take 2 capsules (200 mg total) by mouth 3 (three) times daily as needed. 05/17/15   Evalee Jefferson, PA-C  lisinopril-hydrochlorothiazide (ZESTORETIC) 20-12.5 MG tablet Take 1 tablet by mouth daily. 05/11/22   Orpah Greek, MD  metFORMIN (GLUCOPHAGE) 500 MG tablet Take 1 tablet (500 mg total) by mouth 2 (two) times daily with a meal. 05/11/22   Joseantonio Dittmar, Gwenyth Allegra, MD      Allergies    Patient has no known allergies.    Review of Systems   Review of Systems  Physical Exam Updated Vital Signs BP (!) 181/127   Pulse 96   Temp 99.5 F (37.5 C) (Oral)   Resp (!) 22   Ht 6\' 4"  (1.93 m)   Wt (!) 158.8 kg   SpO2 98%   BMI 42.60 kg/m  Physical Exam Vitals and nursing note reviewed.  Constitutional:      General: He is not in acute distress.    Appearance: He is well-developed.  HENT:     Head: Normocephalic and atraumatic.     Mouth/Throat:     Mouth: Mucous membranes are moist.  Eyes:     General: Vision grossly intact. Gaze aligned appropriately.     Extraocular Movements: Extraocular movements intact.     Conjunctiva/sclera:  Conjunctivae normal.  Cardiovascular:     Rate and Rhythm: Normal rate and regular rhythm.     Pulses: Normal pulses.     Heart sounds: Normal heart sounds, S1 normal and S2 normal. No murmur heard.    No friction rub. No gallop.  Pulmonary:     Effort: Pulmonary effort is normal. No respiratory distress.     Breath sounds: Normal breath sounds.  Abdominal:     Palpations: Abdomen is soft.     Tenderness: There is no abdominal tenderness. There is no guarding or rebound.     Hernia: No hernia is present.  Musculoskeletal:        General: No swelling.     Cervical back: Full passive range of motion without pain, normal range of motion and neck supple. No pain with movement, spinous process tenderness or muscular tenderness. Normal range of motion.     Right lower leg: No edema.     Left lower leg: No edema.  Skin:    General: Skin is warm and dry.     Capillary Refill: Capillary refill takes less than 2 seconds.     Findings: No ecchymosis, erythema, lesion or wound.  Neurological:     Mental Status: He is alert and oriented to person, place, and time.  GCS: GCS eye subscore is 4. GCS verbal subscore is 5. GCS motor subscore is 6.     Cranial Nerves: Cranial nerves 2-12 are intact.     Sensory: Sensation is intact.     Motor: Motor function is intact. No weakness or abnormal muscle tone.     Coordination: Coordination is intact.  Psychiatric:        Mood and Affect: Mood normal.        Speech: Speech normal.        Behavior: Behavior normal.     ED Results / Procedures / Treatments   Labs (all labs ordered are listed, but only abnormal results are displayed) Labs Reviewed  RESP PANEL BY RT-PCR (RSV, FLU A&B, COVID)  RVPGX2 - Abnormal; Notable for the following components:      Result Value   Influenza A by PCR POSITIVE (*)    All other components within normal limits    EKG None  Radiology No results found.  Procedures Procedures    Medications Ordered in  ED Medications - No data to display  ED Course/ Medical Decision Making/ A&P                           Medical Decision Making  Patient appears well.  Lungs are clear.  No difficulty breathing.  Patient tested positive for influenza which explains his symptoms.  Blood pressure noted to be high.  He does have a history of hypertension.  Does not currently have a primary care doctor, given information for follow-up.        Final Clinical Impression(s) / ED Diagnoses Final diagnoses:  Influenza    Rx / DC Orders ED Discharge Orders          Ordered    amLODipine (NORVASC) 10 MG tablet  Daily        05/11/22 0511    lisinopril-hydrochlorothiazide (ZESTORETIC) 20-12.5 MG tablet  Daily        05/11/22 0511    metFORMIN (GLUCOPHAGE) 500 MG tablet  2 times daily with meals        05/11/22 0511    oseltamivir (TAMIFLU) 75 MG capsule  Every 12 hours        05/11/22 0511              Orpah Greek, MD 05/11/22 [phone]

## 2022-05-11 NOTE — ED Triage Notes (Signed)
Pt presents with cough since yesterday with mucus production. Pt now complaining of body aches.

## 2022-09-19 DIAGNOSIS — Z5329 Procedure and treatment not carried out because of patient's decision for other reasons: Secondary | ICD-10-CM | POA: Diagnosis not present

## 2022-09-19 DIAGNOSIS — E119 Type 2 diabetes mellitus without complications: Secondary | ICD-10-CM | POA: Diagnosis not present

## 2022-09-19 DIAGNOSIS — I10 Essential (primary) hypertension: Secondary | ICD-10-CM | POA: Diagnosis not present

## 2022-09-20 ENCOUNTER — Other Ambulatory Visit: Payer: Self-pay

## 2022-09-20 ENCOUNTER — Emergency Department (HOSPITAL_COMMUNITY)
Admission: EM | Admit: 2022-09-20 | Discharge: 2022-09-20 | Disposition: A | Discharge status: Home / Self Care | Payer: Medicaid Other | Attending: Emergency Medicine | Admitting: Emergency Medicine

## 2022-09-20 ENCOUNTER — Encounter (HOSPITAL_COMMUNITY): Payer: Self-pay | Admitting: *Deleted

## 2022-09-20 DIAGNOSIS — I10 Essential (primary) hypertension: Secondary | ICD-10-CM | POA: Diagnosis not present

## 2022-09-20 DIAGNOSIS — R519 Headache, unspecified: Secondary | ICD-10-CM

## 2022-09-20 DIAGNOSIS — Z79899 Other long term (current) drug therapy: Secondary | ICD-10-CM | POA: Insufficient documentation

## 2022-09-20 LAB — CBC WITH DIFFERENTIAL/PLATELET
Abs Immature Granulocytes: 0 10*3/uL (ref 0.00–0.07)
Basophils Absolute: 0 10*3/uL (ref 0.0–0.1)
Basophils Relative: 0 %
Eosinophils Absolute: 0.3 10*3/uL (ref 0.0–0.5)
Eosinophils Relative: 4 %
HCT: 43.5 % (ref 39.0–52.0)
Hemoglobin: 14 g/dL (ref 13.0–17.0)
Lymphocytes Relative: 54 %
Lymphs Abs: 3.6 10*3/uL (ref 0.7–4.0)
MCH: 29.7 pg (ref 26.0–34.0)
MCHC: 32.2 g/dL (ref 30.0–36.0)
MCV: 92.4 fL (ref 80.0–100.0)
Monocytes Absolute: 0.3 10*3/uL (ref 0.1–1.0)
Monocytes Relative: 5 %
Neutro Abs: 2.5 10*3/uL (ref 1.7–7.7)
Neutrophils Relative %: 37 %
Platelets: 287 10*3/uL (ref 150–400)
RBC: 4.71 MIL/uL (ref 4.22–5.81)
RDW: 12.9 % (ref 11.5–15.5)
WBC: 6.7 10*3/uL (ref 4.0–10.5)
nRBC: 0 % (ref 0.0–0.2)

## 2022-09-20 LAB — COMPREHENSIVE METABOLIC PANEL
ALT: 20 U/L (ref 0–44)
AST: 22 U/L (ref 15–41)
Albumin: 3.8 g/dL (ref 3.5–5.0)
Alkaline Phosphatase: 93 U/L (ref 38–126)
Anion gap: 6 (ref 5–15)
BUN: 10 mg/dL (ref 6–20)
CO2: 27 mmol/L (ref 22–32)
Calcium: 9.1 mg/dL (ref 8.9–10.3)
Chloride: 105 mmol/L (ref 98–111)
Creatinine, Ser: 0.84 mg/dL (ref 0.61–1.24)
GFR, Estimated: >60 mL/min (ref >60)
Glucose, Bld: 115 mg/dL — ABNORMAL HIGH (ref 70–99)
Potassium: 3.9 mmol/L (ref 3.5–5.1)
Sodium: 138 mmol/L (ref 135–145)
Total Bilirubin: 0.5 mg/dL (ref 0.3–1.2)
Total Protein: 8 g/dL (ref 6.5–8.1)

## 2022-09-20 MED ORDER — DIPHENHYDRAMINE HCL 50 MG/ML IJ SOLN
25.0000 mg | Freq: Once | INTRAMUSCULAR | Status: AC
  Administered 2022-09-20: 25 mg via INTRAVENOUS
  Filled 2022-09-20: qty 1

## 2022-09-20 MED ORDER — IBUPROFEN 800 MG PO TABS: 800.0000 mg | ORAL_TABLET | Freq: Three times a day (TID) | ORAL | 0 refills | Status: DC | PRN

## 2022-09-20 MED ORDER — LABETALOL HCL 5 MG/ML IV SOLN
20.0000 mg | Freq: Once | INTRAVENOUS | Status: AC
  Administered 2022-09-20: 20 mg via INTRAVENOUS
  Filled 2022-09-20: qty 4

## 2022-09-20 MED ORDER — METOCLOPRAMIDE HCL 5 MG/ML IJ SOLN
10.0000 mg | Freq: Once | INTRAMUSCULAR | Status: AC
  Administered 2022-09-20: 10 mg via INTRAVENOUS
  Filled 2022-09-20: qty 2

## 2022-09-20 MED ORDER — KETOROLAC TROMETHAMINE 30 MG/ML IJ SOLN
30.0000 mg | Freq: Once | INTRAMUSCULAR | Status: AC
  Administered 2022-09-20: 30 mg via INTRAVENOUS
  Filled 2022-09-20: qty 1

## 2022-09-20 NOTE — ED Triage Notes (Signed)
Pt c/o right side headache x 4 days; pt went to urgent care and was told if the headache persists to come to ED

## 2022-09-20 NOTE — ED Provider Notes (Signed)
[place] EMERGENCY DEPARTMENT AT [facility] Provider Note   CSN: [id number] Arrival date & time: 09/20/22  4098     History  Chief Complaint  Patient presents with   Headache    Brandon Esparza is a 37 y.o. male.  Patient has a history of hypertension.  He complains of a headache.  The history is provided by the patient and medical records. No language interpreter was used.  Headache Pain location:  Generalized Quality:  Dull Radiates to:  Does not radiate Severity currently:  7/10 Severity at highest:  8/10 Onset quality:  Gradual Timing:  Constant Progression:  Waxing and waning Chronicity:  New Similar to prior headaches: no   Context: activity   Relieved by:  Nothing Worsened by:  Nothing Associated symptoms: no abdominal pain, no back pain, no congestion, no cough, no diarrhea, no fatigue, no seizures and no sinus pressure        Home Medications Prior to Admission medications   Medication Sig Start Date End Date Taking? Authorizing Provider  ibuprofen (ADVIL) 800 MG tablet Take 1 tablet (800 mg total) by mouth every 8 (eight) hours as needed for moderate pain. 09/20/22  Yes Bethann Berkshire, MD  amLODipine (NORVASC) 10 MG tablet Take 1 tablet (10 mg total) by mouth daily. 05/11/22   Gilda Crease, MD  benzonatate (TESSALON) 100 MG capsule Take 2 capsules (200 mg total) by mouth 3 (three) times daily as needed. 05/17/15   Burgess Amor, PA-C  lisinopril-hydrochlorothiazide (ZESTORETIC) 20-12.5 MG tablet Take 1 tablet by mouth daily. 05/11/22   Gilda Crease, MD  metFORMIN (GLUCOPHAGE) 500 MG tablet Take 1 tablet (500 mg total) by mouth 2 (two) times daily with a meal. 05/11/22   Pollina, Canary Brim, MD  oseltamivir (TAMIFLU) 75 MG capsule Take 1 capsule (75 mg total) by mouth every 12 (twelve) hours. 05/11/22   Gilda Crease, MD      Allergies    Patient has no known allergies.    Review of Systems   Review of Systems   Constitutional:  Negative for appetite change and fatigue.  HENT:  Negative for congestion, ear discharge and sinus pressure.   Eyes:  Negative for discharge.  Respiratory:  Negative for cough.   Cardiovascular:  Negative for chest pain.  Gastrointestinal:  Negative for abdominal pain and diarrhea.  Genitourinary:  Negative for frequency and hematuria.  Musculoskeletal:  Negative for back pain.  Skin:  Negative for rash.  Neurological:  Positive for headaches. Negative for seizures.  Psychiatric/Behavioral:  Negative for hallucinations.     Physical Exam Updated Vital Signs BP (!) 148/90   Pulse 76   Temp 97.8 F (36.6 C) (Oral)   Resp 18   SpO2 99%  Physical Exam Vitals and nursing note reviewed.  Constitutional:      Appearance: He is well-developed.  HENT:     Head: Normocephalic.     Nose: Nose normal.  Eyes:     General: No scleral icterus.    Conjunctiva/sclera: Conjunctivae normal.  Neck:     Thyroid: No thyromegaly.  Cardiovascular:     Rate and Rhythm: Normal rate and regular rhythm.     Heart sounds: No murmur heard.    No friction rub. No gallop.  Pulmonary:     Breath sounds: No stridor. No wheezing or rales.  Chest:     Chest wall: No tenderness.  Abdominal:     General: There is no distension.  Tenderness: There is no abdominal tenderness. There is no rebound.  Musculoskeletal:        General: Normal range of motion.     Cervical back: Neck supple.  Lymphadenopathy:     Cervical: No cervical adenopathy.  Skin:    Findings: No erythema or rash.  Neurological:     Mental Status: He is alert and oriented to person, place, and time.     Motor: No abnormal muscle tone.     Coordination: Coordination normal.  Psychiatric:        Behavior: Behavior normal.     ED Results / Procedures / Treatments   Labs (all labs ordered are listed, but only abnormal results are displayed) Labs Reviewed  COMPREHENSIVE METABOLIC PANEL - Abnormal; Notable for  the following components:      Result Value   Glucose, Bld 115 (*)    All other components within normal limits  CBC WITH DIFFERENTIAL/PLATELET    EKG None  Radiology No results found.  Procedures Procedures    Medications Ordered in ED Medications  ketorolac (TORADOL) 30 MG/ML injection 30 mg (30 mg Intravenous Given 09/20/22 0838)  metoCLOPramide (REGLAN) injection 10 mg (10 mg Intravenous Given 09/20/22 0836)  diphenhydrAMINE (BENADRYL) injection 25 mg (25 mg Intravenous Given 09/20/22 0839)  labetalol (NORMODYNE) injection 20 mg (20 mg Intravenous Given 09/20/22 1610)    ED Course/ Medical Decision Making/ A&P                             Medical Decision Making Amount and/or Complexity of Data Reviewed Labs: ordered.  Risk Prescription drug management.  This patient presents to the ED for concern of headache, this involves an extensive number of treatment options, and is a complaint that carries with it a high risk of complications and morbidity.  The differential diagnosis includes atypical headache and poorly controlled hypertension   Co morbidities that complicate the patient evaluation  Hypertension   Additional history obtained:  Additional history obtained from patient External records from outside source obtained and reviewed including hospital records   Lab Tests:  I Ordered, and personally interpreted labs.  The pertinent results include: CBC and chemistries unremarkable   Imaging Studies ordered:  No imaging  Cardiac Monitoring: / EKG:  The patient was maintained on a cardiac monitor.  I personally viewed and interpreted the cardiac monitored which showed an underlying rhythm of: Sinus rhythm   Consultations Obtained:  No consultant  Problem List / ED Course / Critical interventions / Medication management  Hypertension, headache I ordered medication including GRAIN cocktail for headache and labetalol for high blood  pressure Reevaluation of the patient after these medicines showed that the patient improved I have reviewed the patients home medicines and have made adjustments as needed   Social Determinants of Health:  None   Test / Admission - Considered:  None  Patient with a moderate headache.  And poorly controlled blood pressure.  Patient improved with treatment in the emergency department and will follow-up with her PCP        Final Clinical Impression(s) / ED Diagnoses Final diagnoses:  Bad headache  Primary hypertension    Rx / DC Orders ED Discharge Orders          Ordered    ibuprofen (ADVIL) 800 MG tablet  Every 8 hours PRN        09/20/22 1034  Bethann Berkshire, MD 09/22/22 [phone]

## 2022-09-20 NOTE — Discharge Instructions (Signed)
Make sure you take blood pressure medicine and follow-up with your family doctor or an urgent care next week to see how your blood pressure is doing and your headache.  We will give you some 800 mg Motrin's for your headache

## 2023-02-23 ENCOUNTER — Other Ambulatory Visit: Payer: Self-pay

## 2023-02-23 ENCOUNTER — Encounter (HOSPITAL_BASED_OUTPATIENT_CLINIC_OR_DEPARTMENT_OTHER): Payer: Self-pay | Admitting: Emergency Medicine

## 2023-02-23 ENCOUNTER — Emergency Department (HOSPITAL_BASED_OUTPATIENT_CLINIC_OR_DEPARTMENT_OTHER)
Admission: EM | Admit: 2023-02-23 | Discharge: 2023-02-23 | Disposition: A | Discharge status: Home / Self Care | Payer: Medicaid Other | Attending: Emergency Medicine | Admitting: Emergency Medicine

## 2023-02-23 DIAGNOSIS — Z20822 Contact with and (suspected) exposure to covid-19: Secondary | ICD-10-CM | POA: Diagnosis not present

## 2023-02-23 DIAGNOSIS — Z79899 Other long term (current) drug therapy: Secondary | ICD-10-CM | POA: Diagnosis not present

## 2023-02-23 DIAGNOSIS — E119 Type 2 diabetes mellitus without complications: Secondary | ICD-10-CM | POA: Insufficient documentation

## 2023-02-23 DIAGNOSIS — I10 Essential (primary) hypertension: Secondary | ICD-10-CM | POA: Insufficient documentation

## 2023-02-23 DIAGNOSIS — J029 Acute pharyngitis, unspecified: Secondary | ICD-10-CM | POA: Diagnosis not present

## 2023-02-23 DIAGNOSIS — Z7984 Long term (current) use of oral hypoglycemic drugs: Secondary | ICD-10-CM | POA: Insufficient documentation

## 2023-02-23 DIAGNOSIS — N3000 Acute cystitis without hematuria: Secondary | ICD-10-CM

## 2023-02-23 DIAGNOSIS — R32 Unspecified urinary incontinence: Secondary | ICD-10-CM | POA: Insufficient documentation

## 2023-02-23 LAB — URINALYSIS, ROUTINE W REFLEX MICROSCOPIC
Bilirubin Urine: NEGATIVE
Glucose, UA: NEGATIVE mg/dL
Hgb urine dipstick: NEGATIVE
Ketones, ur: NEGATIVE mg/dL
Nitrite: NEGATIVE
Protein, ur: 30 mg/dL — AB
Specific Gravity, Urine: 1.039 — ABNORMAL HIGH (ref 1.005–1.030)
WBC, UA: >50 WBC/hpf (ref 0–5)
pH: 5.5 (ref 5.0–8.0)

## 2023-02-23 LAB — SARS CORONAVIRUS 2 BY RT PCR: SARS Coronavirus 2 by RT PCR: NEGATIVE

## 2023-02-23 LAB — GROUP A STREP BY PCR: Group A Strep by PCR: NOT DETECTED

## 2023-02-23 MED ORDER — LISINOPRIL-HYDROCHLOROTHIAZIDE 10-12.5 MG PO TABS: 1.0000 | ORAL_TABLET | Freq: Every day | ORAL | 0 refills | Status: DC

## 2023-02-23 MED ORDER — CEPHALEXIN 250 MG PO CAPS
500.0000 mg | ORAL_CAPSULE | Freq: Once | ORAL | Status: AC
  Administered 2023-02-23: 500 mg via ORAL
  Filled 2023-02-23: qty 2

## 2023-02-23 MED ORDER — CEPHALEXIN 500 MG PO CAPS: 500.0000 mg | ORAL_CAPSULE | Freq: Three times a day (TID) | ORAL | 0 refills | Status: DC

## 2023-02-23 MED ORDER — PREDNISONE 10 MG PO TABS: 20.0000 mg | ORAL_TABLET | Freq: Two times a day (BID) | ORAL | 0 refills | Status: DC

## 2023-02-23 MED ORDER — PREDNISONE 20 MG PO TABS
40.0000 mg | ORAL_TABLET | Freq: Once | ORAL | Status: AC
  Administered 2023-02-23: 40 mg via ORAL
  Filled 2023-02-23: qty 2

## 2023-02-23 NOTE — ED Provider Notes (Signed)
[place] EMERGENCY DEPARTMENT AT [facility] Provider Note   CSN: [id number] Arrival date & time: 02/23/23  0015     History  Chief Complaint  Patient presents with   Sore Throat    Brandon Esparza is a 37 y.o. male.  Patient is a 37 year old male with history of hypertension and type 2 diabetes.  Patient presenting today for evaluation of sore throat.  This has been ongoing for the past 3 days.  He describes pain when he swallows or tries to eat.  He denies fevers or chills.  No cough.  He also describes issues with urinary continence that seems to occur at night when using his CPAP machine.  He denies dysuria.  The history is provided by the patient.       Home Medications Prior to Admission medications   Medication Sig Start Date End Date Taking? Authorizing Provider  amLODipine (NORVASC) 10 MG tablet Take 1 tablet (10 mg total) by mouth daily. 05/11/22   Gilda Crease, MD  benzonatate (TESSALON) 100 MG capsule Take 2 capsules (200 mg total) by mouth 3 (three) times daily as needed. 05/17/15   Burgess Amor, PA-C  ibuprofen (ADVIL) 800 MG tablet Take 1 tablet (800 mg total) by mouth every 8 (eight) hours as needed for moderate pain. 09/20/22   Bethann Berkshire, MD  lisinopril-hydrochlorothiazide (ZESTORETIC) 20-12.5 MG tablet Take 1 tablet by mouth daily. 05/11/22   Gilda Crease, MD  metFORMIN (GLUCOPHAGE) 500 MG tablet Take 1 tablet (500 mg total) by mouth 2 (two) times daily with a meal. 05/11/22   Pollina, Canary Brim, MD  oseltamivir (TAMIFLU) 75 MG capsule Take 1 capsule (75 mg total) by mouth every 12 (twelve) hours. 05/11/22   Gilda Crease, MD      Allergies    Patient has no known allergies.    Review of Systems   Review of Systems  All other systems reviewed and are negative.   Physical Exam Updated Vital Signs BP (!) 202/136 (BP Location: Right Arm)   Pulse 79   Temp 97.9 F (36.6 C)   Resp 18   SpO2 98%  Physical  Exam Vitals and nursing note reviewed.  Constitutional:      General: He is not in acute distress.    Appearance: He is well-developed. He is not diaphoretic.  HENT:     Head: Normocephalic and atraumatic.     Mouth/Throat:     Pharynx: Posterior oropharyngeal erythema present. No pharyngeal swelling, oropharyngeal exudate or uvula swelling.     Tonsils: No tonsillar exudate or tonsillar abscesses.  Cardiovascular:     Rate and Rhythm: Normal rate and regular rhythm.     Heart sounds: No murmur heard.    No friction rub.  Pulmonary:     Effort: Pulmonary effort is normal. No respiratory distress.     Breath sounds: Normal breath sounds. No wheezing or rales.  Abdominal:     General: Bowel sounds are normal. There is no distension.     Palpations: Abdomen is soft.     Tenderness: There is no abdominal tenderness.  Musculoskeletal:        General: Normal range of motion.     Cervical back: Normal range of motion and neck supple.  Skin:    General: Skin is warm and dry.  Neurological:     Mental Status: He is alert and oriented to person, place, and time.     Coordination: Coordination normal.  ED Results / Procedures / Treatments   Labs (all labs ordered are listed, but only abnormal results are displayed) Labs Reviewed  URINALYSIS, ROUTINE W REFLEX MICROSCOPIC - Abnormal; Notable for the following components:      Result Value   Specific Gravity, Urine 1.039 (*)    Protein, ur 30 (*)    Leukocytes,Ua SMALL (*)    Bacteria, UA FEW (*)    Non Squamous Epithelial 0-5 (*)    All other components within normal limits  SARS CORONAVIRUS 2 BY RT PCR  GROUP A STREP BY PCR    EKG None  Radiology No results found.  Procedures Procedures    Medications Ordered in ED Medications  cephALEXin (KEFLEX) capsule 500 mg (has no administration in time range)  predniSONE (DELTASONE) tablet 40 mg (has no administration in time range)    ED Course/ Medical Decision Making/  A&P  Patient is a 37 year old male presenting with sore throat and urinary incontinence as described in the HPI.  Patient arrives with stable vital signs and is afebrile.  Physical examination reveals erythema of the posterior oropharynx, but is otherwise unremarkable.  COVID and strep test both negative.  Urinalysis reveals greater than 50 WBCs.  Patient to be treated for a UTI with Keflex.  I suspect that his throat is viral, but if related to tonsillitis I feel as though Keflex will cover this as well.  I will also prescribe prednisone as an anti-inflammatory for his throat.  Final Clinical Impression(s) / ED Diagnoses Final diagnoses:  None    Rx / DC Orders ED Discharge Orders     None         Geoffery Lyons, MD 02/23/23 [phone]

## 2023-02-23 NOTE — ED Triage Notes (Addendum)
Pt reports having a cold about a week ago.  After washing his hair tonight he began having severe throat pain. Painful to swallow.  Pt also reports having episodes of "wetting the bed"  Has a hx of sleep apnea and wonders if this is causing him not to have a sensation of having to urinate prior to it happening.  No pain with urination otherwise.  Pt is non compliant with BP meds

## 2023-02-23 NOTE — Discharge Instructions (Addendum)
Begin taking Keflex and prednisone as prescribed.  You can take ibuprofen 600 mg every 6 hours as needed for pain.  Follow-up with primary doctor if symptoms are not improving in the next few days.

## 2023-04-11 ENCOUNTER — Emergency Department (HOSPITAL_BASED_OUTPATIENT_CLINIC_OR_DEPARTMENT_OTHER)
Admission: EM | Admit: 2023-04-11 | Discharge: 2023-04-11 | Disposition: A | Discharge status: Home / Self Care | Payer: Medicaid Other | Attending: Emergency Medicine | Admitting: Emergency Medicine

## 2023-04-11 ENCOUNTER — Encounter (HOSPITAL_BASED_OUTPATIENT_CLINIC_OR_DEPARTMENT_OTHER): Payer: Self-pay

## 2023-04-11 ENCOUNTER — Emergency Department (HOSPITAL_BASED_OUTPATIENT_CLINIC_OR_DEPARTMENT_OTHER): Payer: Medicaid Other

## 2023-04-11 ENCOUNTER — Other Ambulatory Visit: Payer: Self-pay

## 2023-04-11 DIAGNOSIS — N5089 Other specified disorders of the male genital organs: Secondary | ICD-10-CM | POA: Diagnosis not present

## 2023-04-11 DIAGNOSIS — N451 Epididymitis: Secondary | ICD-10-CM | POA: Diagnosis not present

## 2023-04-11 DIAGNOSIS — F17210 Nicotine dependence, cigarettes, uncomplicated: Secondary | ICD-10-CM | POA: Insufficient documentation

## 2023-04-11 DIAGNOSIS — E119 Type 2 diabetes mellitus without complications: Secondary | ICD-10-CM | POA: Diagnosis not present

## 2023-04-11 DIAGNOSIS — Z7984 Long term (current) use of oral hypoglycemic drugs: Secondary | ICD-10-CM | POA: Diagnosis not present

## 2023-04-11 DIAGNOSIS — N5082 Scrotal pain: Secondary | ICD-10-CM | POA: Diagnosis not present

## 2023-04-11 DIAGNOSIS — I10 Essential (primary) hypertension: Secondary | ICD-10-CM | POA: Diagnosis not present

## 2023-04-11 DIAGNOSIS — N503 Cyst of epididymis: Secondary | ICD-10-CM | POA: Diagnosis not present

## 2023-04-11 DIAGNOSIS — Z79899 Other long term (current) drug therapy: Secondary | ICD-10-CM | POA: Diagnosis not present

## 2023-04-11 DIAGNOSIS — R1031 Right lower quadrant pain: Secondary | ICD-10-CM | POA: Diagnosis not present

## 2023-04-11 LAB — LIPASE, BLOOD: Lipase: 61 U/L — ABNORMAL HIGH (ref 11–51)

## 2023-04-11 LAB — COMPREHENSIVE METABOLIC PANEL
ALT: 40 U/L (ref 0–44)
AST: 34 U/L (ref 15–41)
Albumin: 3.7 g/dL (ref 3.5–5.0)
Alkaline Phosphatase: 100 U/L (ref 38–126)
Anion gap: 7 (ref 5–15)
BUN: 10 mg/dL (ref 6–20)
CO2: 25 mmol/L (ref 22–32)
Calcium: 8.8 mg/dL — ABNORMAL LOW (ref 8.9–10.3)
Chloride: 107 mmol/L (ref 98–111)
Creatinine, Ser: 1.01 mg/dL (ref 0.61–1.24)
GFR, Estimated: >60 mL/min (ref >60)
Glucose, Bld: 106 mg/dL — ABNORMAL HIGH (ref 70–99)
Potassium: 3.7 mmol/L (ref 3.5–5.1)
Sodium: 139 mmol/L (ref 135–145)
Total Bilirubin: 0.7 mg/dL (ref <1.2)
Total Protein: 7.6 g/dL (ref 6.5–8.1)

## 2023-04-11 LAB — URINALYSIS, ROUTINE W REFLEX MICROSCOPIC
Bilirubin Urine: NEGATIVE
Glucose, UA: NEGATIVE mg/dL
Ketones, ur: NEGATIVE mg/dL
Leukocytes,Ua: NEGATIVE
Nitrite: NEGATIVE
Protein, ur: NEGATIVE mg/dL
Specific Gravity, Urine: 1.025 (ref 1.005–1.030)
pH: 6 (ref 5.0–8.0)

## 2023-04-11 LAB — URINALYSIS, MICROSCOPIC (REFLEX)

## 2023-04-11 LAB — CBC WITH DIFFERENTIAL/PLATELET
Abs Immature Granulocytes: 0.01 10*3/uL (ref 0.00–0.07)
Basophils Absolute: 0 10*3/uL (ref 0.0–0.1)
Basophils Relative: 1 %
Eosinophils Absolute: 0.1 10*3/uL (ref 0.0–0.5)
Eosinophils Relative: 1 %
HCT: 40 % (ref 39.0–52.0)
Hemoglobin: 13 g/dL (ref 13.0–17.0)
Immature Granulocytes: 0 %
Lymphocytes Relative: 38 %
Lymphs Abs: 3 10*3/uL (ref 0.7–4.0)
MCH: 29.9 pg (ref 26.0–34.0)
MCHC: 32.5 g/dL (ref 30.0–36.0)
MCV: 92 fL (ref 80.0–100.0)
Monocytes Absolute: 0.4 10*3/uL (ref 0.1–1.0)
Monocytes Relative: 5 %
Neutro Abs: 4.3 10*3/uL (ref 1.7–7.7)
Neutrophils Relative %: 55 %
Platelets: 269 10*3/uL (ref 150–400)
RBC: 4.35 MIL/uL (ref 4.22–5.81)
RDW: 13 % (ref 11.5–15.5)
WBC: 7.8 10*3/uL (ref 4.0–10.5)
nRBC: 0 % (ref 0.0–0.2)

## 2023-04-11 MED ORDER — MORPHINE SULFATE (PF) 4 MG/ML IV SOLN
4.0000 mg | Freq: Once | INTRAVENOUS | Status: AC
  Administered 2023-04-11: 4 mg via INTRAVENOUS
  Filled 2023-04-11: qty 1

## 2023-04-11 MED ORDER — SODIUM CHLORIDE 0.9 % IV BOLUS
1000.0000 mL | Freq: Once | INTRAVENOUS | Status: AC
  Administered 2023-04-11: 1000 mL via INTRAVENOUS

## 2023-04-11 MED ORDER — LEVOFLOXACIN 500 MG PO TABS: 500.0000 mg | ORAL_TABLET | Freq: Every day | ORAL | 0 refills | Status: DC

## 2023-04-11 MED ORDER — HYDROMORPHONE HCL 1 MG/ML IJ SOLN
0.5000 mg | Freq: Once | INTRAMUSCULAR | Status: AC
  Administered 2023-04-11: 0.5 mg via INTRAVENOUS
  Filled 2023-04-11: qty 1

## 2023-04-11 MED ORDER — SODIUM CHLORIDE 0.9 % IV SOLN
1.0000 g | Freq: Once | INTRAVENOUS | Status: AC
  Administered 2023-04-11: 1 g via INTRAVENOUS
  Filled 2023-04-11: qty 10

## 2023-04-11 MED ORDER — LEVOFLOXACIN 500 MG PO TABS
500.0000 mg | ORAL_TABLET | Freq: Once | ORAL | Status: AC
  Administered 2023-04-11: 500 mg via ORAL
  Filled 2023-04-11: qty 1

## 2023-04-11 MED ORDER — KETOROLAC TROMETHAMINE 30 MG/ML IJ SOLN
30.0000 mg | Freq: Once | INTRAMUSCULAR | Status: AC
  Administered 2023-04-11: 30 mg via INTRAVENOUS
  Filled 2023-04-11: qty 1

## 2023-04-11 MED ORDER — IOHEXOL 300 MG/ML  SOLN
125.0000 mL | Freq: Once | INTRAMUSCULAR | Status: AC | PRN
  Administered 2023-04-11: 125 mL via INTRAVENOUS

## 2023-04-11 MED ORDER — ONDANSETRON HCL 4 MG/2ML IJ SOLN
4.0000 mg | Freq: Once | INTRAMUSCULAR | Status: AC
  Administered 2023-04-11: 4 mg via INTRAVENOUS
  Filled 2023-04-11: qty 2

## 2023-04-11 NOTE — Discharge Instructions (Addendum)
You were seen in the emerged department for right groin pain The ultrasound taken shows an infection called epididymitis For this reason we gave you a dose of 2 different antibiotics here You will need to go to your pharmacy and pick up the rest of your antibiotic course Please go to the [facility] in [place] and pick up Levaquin and take once a day for the next 9 days as directed You will need to follow-up on the results of your gonorrhea and Chlamydia testing here Please call the office of Dr.qiao as she is accepting new patients in the area to establish care with a primary care doctor Return to the emergency department for severe pain, fevers, trouble with urination or any other concerns

## 2023-04-11 NOTE — ED Provider Notes (Signed)
[place] EMERGENCY DEPARTMENT AT [facility] Provider Note  CSN: [id number] Arrival date & time: 04/11/23 0141  Chief Complaint(s) Groin Pain (right)  HPI Brandon Esparza is a 37 y.o. male with past medical history as below, significant for DM, hypertension who presents to the ED with complaint of right lower quadrant, right sided pelvic pain  Symptoms ongoing the past 24 to 48 hours.  Patient with right side groin/pelvic pain that seems to worsen with movement.  Denies any precipitating factor.  No falls or trauma.  No change in bowel or bladder function, no nausea or vomiting.  Prior abdominal surgery includes appendectomy.  Denies any penile or scrotal swelling.  Pain does not radiate.  Described as sharp and stabbing.  Past Medical History Past Medical History:  Diagnosis Date   Diabetes mellitus without complication (HCC)    Hypertension    There are no problems to display for this patient.  Home Medication(s) Prior to Admission medications   Medication Sig Start Date End Date Taking? Authorizing Provider  levofloxacin (LEVAQUIN) 500 MG tablet Take 1 tablet (500 mg total) by mouth daily. 04/11/23  Yes Estelle June A, DO  amLODipine (NORVASC) 10 MG tablet Take 1 tablet (10 mg total) by mouth daily. 05/11/22   Gilda Crease, MD  benzonatate (TESSALON) 100 MG capsule Take 2 capsules (200 mg total) by mouth 3 (three) times daily as needed. 05/17/15   Burgess Amor, PA-C  cephALEXin (KEFLEX) 500 MG capsule Take 1 capsule (500 mg total) by mouth 3 (three) times daily. 02/23/23   Geoffery Lyons, MD  ibuprofen (ADVIL) 800 MG tablet Take 1 tablet (800 mg total) by mouth every 8 (eight) hours as needed for moderate pain. 09/20/22   Bethann Berkshire, MD  lisinopril-hydrochlorothiazide (ZESTORETIC) 10-12.5 MG tablet Take 1 tablet by mouth daily. 02/23/23   Geoffery Lyons, MD  metFORMIN (GLUCOPHAGE) 500 MG tablet Take 1 tablet (500 mg total) by mouth 2 (two) times daily with a meal.  05/11/22   Pollina, Canary Brim, MD  oseltamivir (TAMIFLU) 75 MG capsule Take 1 capsule (75 mg total) by mouth every 12 (twelve) hours. 05/11/22   Gilda Crease, MD  predniSONE (DELTASONE) 10 MG tablet Take 2 tablets (20 mg total) by mouth 2 (two) times daily with a meal. 02/23/23   Geoffery Lyons, MD                                                                                                                                    Past Surgical History Past Surgical History:  Procedure Laterality Date   ADENOIDECTOMY     APPENDECTOMY     TONSILLECTOMY     Family History History reviewed. No pertinent family history.  Social History Social History   Tobacco Use   Smoking status: Every Day    Current packs/day: 0.50    Types: Cigarettes   Smokeless tobacco: Never  Substance Use  Topics   Alcohol use: No   Drug use: No   Allergies Patient has no known allergies.  Review of Systems Review of Systems  Constitutional:  Negative for chills and fever.  Respiratory:  Negative for shortness of breath.   Cardiovascular:  Negative for chest pain.  Gastrointestinal:  Positive for abdominal pain. Negative for nausea and vomiting.  Genitourinary:  Negative for penile pain, scrotal swelling and urgency.  Neurological:  Negative for light-headedness and headaches.  All other systems reviewed and are negative.   Physical Exam Vital Signs  I have reviewed the triage vital signs BP (!) 142/90   Pulse 65   Temp 97.9 F (36.6 C) (Oral)   Resp 17   Ht 6\' 4"  (1.93 m)   Wt (!) 158.8 kg   SpO2 98%   BMI 42.60 kg/m  Physical Exam Vitals and nursing note reviewed.  Constitutional:      General: He is not in acute distress.    Appearance: He is well-developed. He is obese.  HENT:     Head: Normocephalic and atraumatic.     Right Ear: External ear normal.     Left Ear: External ear normal.     Mouth/Throat:     Mouth: Mucous membranes are moist.  Eyes:     General: No scleral  icterus. Cardiovascular:     Rate and Rhythm: Normal rate and regular rhythm.     Pulses: Normal pulses.     Heart sounds: Normal heart sounds.  Pulmonary:     Effort: Pulmonary effort is normal. No respiratory distress.     Breath sounds: Normal breath sounds.  Abdominal:     General: Abdomen is flat.     Palpations: Abdomen is soft.     Tenderness: There is abdominal tenderness. There is no guarding or rebound. Negative signs include Murphy's sign.    Musculoskeletal:     Cervical back: No rigidity.     Right lower leg: No edema.     Left lower leg: No edema.  Skin:    General: Skin is warm and dry.     Capillary Refill: Capillary refill takes less than 2 seconds.  Neurological:     Mental Status: He is alert.  Psychiatric:        Mood and Affect: Mood normal.        Behavior: Behavior normal.     ED Results and Treatments Labs (all labs ordered are listed, but only abnormal results are displayed) Labs Reviewed  COMPREHENSIVE METABOLIC PANEL - Abnormal; Notable for the following components:      Result Value   Glucose, Bld 106 (*)    Calcium 8.8 (*)    All other components within normal limits  LIPASE, BLOOD - Abnormal; Notable for the following components:   Lipase 61 (*)    All other components within normal limits  URINALYSIS, ROUTINE W REFLEX MICROSCOPIC - Abnormal; Notable for the following components:   Hgb urine dipstick TRACE (*)    All other components within normal limits  URINALYSIS, MICROSCOPIC (REFLEX) - Abnormal; Notable for the following components:   Bacteria, UA RARE (*)    All other components within normal limits  CBC WITH DIFFERENTIAL/PLATELET  GC/CHLAMYDIA PROBE AMP ([place]) NOT AT [facility]  Radiology  Pertinent labs & imaging results that were available during my care of the patient were reviewed by me and considered in  my medical decision making (see MDM for details).  Medications Ordered in ED Medications  morphine (PF) 4 MG/ML injection 4 mg (4 mg Intravenous Given 04/11/23 0256)  ondansetron (ZOFRAN) injection 4 mg (4 mg Intravenous Given 04/11/23 0256)  sodium chloride 0.9 % bolus 1,000 mL (0 mLs Intravenous Stopped 04/11/23 0358)  HYDROmorphone (DILAUDID) injection 0.5 mg (0.5 mg Intravenous Given 04/11/23 0332)  iohexol (OMNIPAQUE) 300 MG/ML solution 125 mL (125 mLs Intravenous Contrast Given 04/11/23 0356)  HYDROmorphone (DILAUDID) injection 0.5 mg (0.5 mg Intravenous Given 04/11/23 0644)  cefTRIAXone (ROCEPHIN) 1 g in sodium chloride 0.9 % 100 mL IVPB (0 g Intravenous Stopped 04/11/23 0940)  ketorolac (TORADOL) 30 MG/ML injection 30 mg (30 mg Intravenous Given 04/11/23 0902)  levofloxacin (LEVAQUIN) tablet 500 mg (500 mg Oral Given 04/11/23 0940)                                                                                                                                     Procedures Procedures  (including critical care time)  Medical Decision Making / ED Course    Medical Decision Making:    Quevin Huesman is a 37 y.o. male  with past medical history as below, significant for DM, hypertension who presents to the ED with complaint of right lower quadrant, right sided pelvic pain. The complaint involves an extensive differential diagnosis and also carries with it a high risk of complications and morbidity.  Serious etiology was considered. Ddx includes but is not limited to: Differential diagnosis includes but is not exclusive to acute appendicitis, renal colic, testicular torsion, urinary tract infection, prostatitis,  diverticulitis, small bowel obstruction, colitis, abdominal aortic aneurysm, gastroenteritis, constipation etc.   Complete initial physical exam performed, notably the patient appears to have discomfort to her abdomen, HDS Reviewed and confirmed nursing documentation for past medical  history, family history, social history.  Vital signs reviewed.    Patient with right lower quadrant pelvic/abdominal pain.  Will get screening labs, analgesia, give fluids and get imaging of the abdomen pelvis  Clinical Course as of 04/12/23 0705  Thu Apr 11, 2023  0551 Symptoms resolved  [SG]  0641 Bacteria, UA(!): RARE He denies any dysuria or urgency. [SG]  [id number] CT imaging is unremarkable [SG]  [phone] Patient reports his pain has resumed, requesting further analgesia [SG]  0643 BP(!): 204/115 History of hypertension, will recheck, elevation perhaps secondary to acute discomfort.  Denies any chest pain, headache or vision changes, hypertensive emergency unlikely at this time [SG]  0945 Ultrasound most consistent with epididymitis.  Discussed findings with patient.  He is sexually active with 2 females and does not use barrier protection.  No prior history of STI and no other STI symptoms.  He has read on the age cutoff where epididymitis is  equally as likely to be caused by STI as enteric organism.  Will cover for both with a dose of ceftriaxone here and 10 days of oral Levaquin.  He will need to follow-up on his GC chlamydia results. [MP]    Clinical Course User Index [MP] Royanne Foots, DO [SG] Sloan Leiter, DO    Brief summary: 37 yo male here w/ rlq , right groin pain.  Labs stable CT stable Get scrotal ultrasound Exam is stable, abd not peritoneal Handoff to incoming EDP pending ultrasound Pt feeling better                 Additional history obtained: -Additional history obtained from na -External records from outside source obtained and reviewed including: Chart review including previous notes, labs, imaging, consultation notes including  Prior ed visits Prior labs   Lab Tests: -I ordered, reviewed, and interpreted labs.   The pertinent results include:   Labs Reviewed  COMPREHENSIVE METABOLIC PANEL - Abnormal; Notable for the following components:       Result Value   Glucose, Bld 106 (*)    Calcium 8.8 (*)    All other components within normal limits  LIPASE, BLOOD - Abnormal; Notable for the following components:   Lipase 61 (*)    All other components within normal limits  URINALYSIS, ROUTINE W REFLEX MICROSCOPIC - Abnormal; Notable for the following components:   Hgb urine dipstick TRACE (*)    All other components within normal limits  URINALYSIS, MICROSCOPIC (REFLEX) - Abnormal; Notable for the following components:   Bacteria, UA RARE (*)    All other components within normal limits  CBC WITH DIFFERENTIAL/PLATELET  GC/CHLAMYDIA PROBE AMP ([place]) NOT AT [facility]    Notable for labs stable  EKG   EKG Interpretation Date/Time:    Ventricular Rate:    PR Interval:    QRS Duration:    QT Interval:    QTC Calculation:   R Axis:      Text Interpretation:           Imaging Studies ordered: I ordered imaging studies including CTAP, scrotal US ([place] PENDING) I independently visualized the following imaging with scope of interpretation limited to determining acute life threatening conditions related to emergency care; findings noted above I independently visualized and interpreted imaging. I agree with the radiologist interpretation   Medicines ordered and prescription drug management: Meds ordered this encounter  Medications   morphine (PF) 4 MG/ML injection 4 mg   ondansetron (ZOFRAN) injection 4 mg   sodium chloride 0.9 % bolus 1,000 mL   HYDROmorphone (DILAUDID) injection 0.5 mg   iohexol (OMNIPAQUE) 300 MG/ML solution 125 mL   HYDROmorphone (DILAUDID) injection 0.5 mg   cefTRIAXone (ROCEPHIN) 1 g in sodium chloride 0.9 % 100 mL IVPB    Order Specific Question:   Antibiotic Indication:    Answer:   Other Indication (list below)    Order Specific Question:   Other Indication:    Answer:   epididymitis   ketorolac (TORADOL) 30 MG/ML injection 30 mg   levofloxacin (LEVAQUIN) tablet 500 mg   levofloxacin  (LEVAQUIN) 500 MG tablet    Sig: Take 1 tablet (500 mg total) by mouth daily.    Dispense:  9 tablet    Refill:  0    -I have reviewed the patients home medicines and have made adjustments as needed   Consultations Obtained: na   Cardiac Monitoring: Continuous pulse oximetry interpreted by myself, 99%  on RA.    Social Determinants of Health:  Diagnosis or treatment significantly limited by social determinants of health: current smoker and obesity   Reevaluation: After the interventions noted above, I reevaluated the patient and found that they have improved  Co morbidities that complicate the patient evaluation  Past Medical History:  Diagnosis Date   Diabetes mellitus without complication (HCC)    Hypertension       Dispostion: Disposition decision including need for hospitalization was considered, and patient disposition pending at time of sign out.    Final Clinical Impression(s) / ED Diagnoses PENDING   Sloan Leiter, DO 04/12/23 4098

## 2023-04-11 NOTE — ED Triage Notes (Signed)
Yesterday pt began having right groin pain that worsens with movement. Did not injure groin. Pt ambulatory but in pain

## 2023-04-11 NOTE — ED Provider Notes (Signed)
  Physical Exam  BP (!) 142/90   Pulse 65   Temp 97.9 F (36.6 C) (Oral)   Resp 17   Ht 6\' 4"  (1.93 m)   Wt (!) 158.8 kg   SpO2 98%   BMI 42.60 kg/m   Physical Exam Vitals and nursing note reviewed.  HENT:     Head: Normocephalic and atraumatic.  Eyes:     Pupils: Pupils are equal, round, and reactive to light.  Cardiovascular:     Rate and Rhythm: Normal rate and regular rhythm.  Pulmonary:     Effort: Pulmonary effort is normal.     Breath sounds: Normal breath sounds.  Abdominal:     Palpations: Abdomen is soft.     Tenderness: There is no abdominal tenderness.  Skin:    General: Skin is warm and dry.  Neurological:     Mental Status: He is alert.  Psychiatric:        Mood and Affect: Mood normal.     Procedures  Procedures  ED Course / MDM   Clinical Course as of 04/11/23 0947  Thu Apr 11, 2023  0551 Symptoms resolved  [SG]  0641 Bacteria, UA(!): RARE He denies any dysuria or urgency. [SG]  [id number] CT imaging is unremarkable [SG]  [phone] Patient reports his pain has resumed, requesting further analgesia [SG]  0643 BP(!): 204/115 History of hypertension, will recheck, elevation perhaps secondary to acute discomfort.  Denies any chest pain, headache or vision changes, hypertensive emergency unlikely at this time [SG]  0945 Ultrasound most consistent with epididymitis.  Discussed findings with patient.  He is sexually active with 2 females and does not use barrier protection.  No prior history of STI and no other STI symptoms.  He has read on the age cutoff where epididymitis is equally as likely to be caused by STI as enteric organism.  Will cover for both with a dose of ceftriaxone here and 10 days of oral Levaquin.  He will need to follow-up on his GC chlamydia results. [MP]    Clinical Course User Index [MP] Royanne Foots, DO [SG] Sloan Leiter, DO   Medical Decision Making I, Estelle June DO, have assumed care of this patient from the previous provider  pending ultrasound, reevaluation and disposition  Amount and/or Complexity of Data Reviewed Labs: ordered. Decision-making details documented in ED Course. Radiology: ordered.  Risk Prescription drug management.   Final diagnosis Epididymitis       Royanne Foots, DO 04/11/23 5284

## 2023-04-12 LAB — GC/CHLAMYDIA PROBE AMP (~~LOC~~) NOT AT ARMC
Chlamydia: POSITIVE — AB
Comment: NEGATIVE
Comment: NORMAL
Neisseria Gonorrhea: NEGATIVE

## 2023-05-30 ENCOUNTER — Emergency Department (HOSPITAL_BASED_OUTPATIENT_CLINIC_OR_DEPARTMENT_OTHER)
Admission: EM | Admit: 2023-05-30 | Discharge: 2023-05-30 | Disposition: A | Discharge status: Home / Self Care | Payer: Medicaid Other | Attending: Emergency Medicine | Admitting: Emergency Medicine

## 2023-05-30 ENCOUNTER — Encounter (HOSPITAL_BASED_OUTPATIENT_CLINIC_OR_DEPARTMENT_OTHER): Payer: Self-pay | Admitting: Emergency Medicine

## 2023-05-30 ENCOUNTER — Other Ambulatory Visit: Payer: Self-pay

## 2023-05-30 DIAGNOSIS — I10 Essential (primary) hypertension: Secondary | ICD-10-CM | POA: Insufficient documentation

## 2023-05-30 DIAGNOSIS — Z79899 Other long term (current) drug therapy: Secondary | ICD-10-CM | POA: Insufficient documentation

## 2023-05-30 DIAGNOSIS — R1032 Left lower quadrant pain: Secondary | ICD-10-CM | POA: Insufficient documentation

## 2023-05-30 MED ORDER — DOXYCYCLINE HYCLATE 100 MG PO TABS: 100.0000 mg | ORAL_TABLET | Freq: Two times a day (BID) | ORAL | 0 refills | Status: AC

## 2023-05-30 MED ORDER — LISINOPRIL-HYDROCHLOROTHIAZIDE 10-12.5 MG PO TABS: 1.0000 | ORAL_TABLET | Freq: Every day | ORAL | 2 refills | Status: DC

## 2023-05-30 MED ORDER — AMLODIPINE BESYLATE 10 MG PO TABS: 10.0000 mg | ORAL_TABLET | Freq: Every day | ORAL | 2 refills | Status: DC

## 2023-05-30 NOTE — Discharge Instructions (Addendum)
Resume taking your blood pressure medications.  Follow-up with a primary care doctor to have that rechecked.  Take the antibiotics as prescribed until you are finished

## 2023-05-30 NOTE — ED Triage Notes (Signed)
Pt c/o LT side groin pain since yesterday. PT denies injury. Pt also reports being out of b/p meds x 2 weeks.Pt denies CP

## 2023-05-30 NOTE — ED Provider Notes (Signed)
[place] EMERGENCY DEPARTMENT AT [facility] Provider Note   CSN: [id number] Arrival date & time: 05/30/23  0944     History  Chief Complaint  Patient presents with   Groin Pain    Brandon Esparza is a 38 y.o. male.   Groin Pain     Patient presented to the ED for evaluation of left groin pain.  Patient states he had an episode yesterday that was mildly uncomfortable.  Patient states he is not really having any pain or discomfort now.  Patient was concerned though because he was in the emergency room in December for groin pain and was diagnosed with epididymitis.  Patient states the pain was very severe at that time.  Patient also has history of hypertension.  He has not taken any in his medications for couple weeks.  He does not have a primary care doctor.  Patient denies any symptoms associated with hypertension .  No chest pain no difficulty breathing  Home Medications Prior to Admission medications   Medication Sig Start Date End Date Taking? Authorizing Provider  doxycycline (VIBRA-TABS) 100 MG tablet Take 1 tablet (100 mg total) by mouth 2 (two) times daily for 7 days. 05/30/23 06/06/23 Yes Linwood Dibbles, MD  amLODipine (NORVASC) 10 MG tablet Take 1 tablet (10 mg total) by mouth daily. 05/30/23   Linwood Dibbles, MD  benzonatate (TESSALON) 100 MG capsule Take 2 capsules (200 mg total) by mouth 3 (three) times daily as needed. 05/17/15   Burgess Amor, PA-C  cephALEXin (KEFLEX) 500 MG capsule Take 1 capsule (500 mg total) by mouth 3 (three) times daily. 02/23/23   Geoffery Lyons, MD  ibuprofen (ADVIL) 800 MG tablet Take 1 tablet (800 mg total) by mouth every 8 (eight) hours as needed for moderate pain. 09/20/22   Bethann Berkshire, MD  lisinopril-hydrochlorothiazide (ZESTORETIC) 10-12.5 MG tablet Take 1 tablet by mouth daily. 05/30/23   Linwood Dibbles, MD  metFORMIN (GLUCOPHAGE) 500 MG tablet Take 1 tablet (500 mg total) by mouth 2 (two) times daily with a meal. 05/11/22   Pollina,  Canary Brim, MD  oseltamivir (TAMIFLU) 75 MG capsule Take 1 capsule (75 mg total) by mouth every 12 (twelve) hours. 05/11/22   Gilda Crease, MD  predniSONE (DELTASONE) 10 MG tablet Take 2 tablets (20 mg total) by mouth 2 (two) times daily with a meal. 02/23/23   Geoffery Lyons, MD      Allergies    Patient has no known allergies.    Review of Systems   Review of Systems  Physical Exam Updated Vital Signs BP (!) 177/127 (BP Location: Right Arm)   Pulse 80   Temp 98.6 F (37 C)   Resp 18   Wt (!) 154.2 kg   SpO2 96%   BMI 41.39 kg/m  Physical Exam Vitals and nursing note reviewed.  Constitutional:      General: He is not in acute distress.    Appearance: He is well-developed.  HENT:     Head: Normocephalic and atraumatic.     Right Ear: External ear normal.     Left Ear: External ear normal.  Eyes:     General: No scleral icterus.       Right eye: No discharge.        Left eye: No discharge.     Conjunctiva/sclera: Conjunctivae normal.  Neck:     Trachea: No tracheal deviation.  Cardiovascular:     Rate and Rhythm: Normal rate and regular rhythm.  Pulmonary:  Effort: Pulmonary effort is normal. No respiratory distress.     Breath sounds: Normal breath sounds. No stridor. No wheezing or rales.  Abdominal:     General: Bowel sounds are normal. There is no distension.     Palpations: Abdomen is soft.     Tenderness: There is no abdominal tenderness. There is no guarding or rebound.  Genitourinary:    Penis: Normal.      Comments: No testicular tenderness or discomfort, no hernia noted Musculoskeletal:        General: No tenderness or deformity.     Cervical back: Neck supple.  Skin:    General: Skin is warm and dry.     Findings: No rash.  Neurological:     General: No focal deficit present.     Mental Status: He is alert.     Cranial Nerves: No cranial nerve deficit, dysarthria or facial asymmetry.     Sensory: No sensory deficit.     Motor: No  abnormal muscle tone or seizure activity.     Coordination: Coordination normal.  Psychiatric:        Mood and Affect: Mood normal.     ED Results / Procedures / Treatments   Labs (all labs ordered are listed, but only abnormal results are displayed) Labs Reviewed - No data to display  EKG None  Radiology No results found.  Procedures Procedures    Medications Ordered in ED Medications - No data to display  ED Course/ Medical Decision Making/ A&P                                 Medical Decision Making Risk Prescription drug management.   Results were reviewed from the patient's previous ED visit.  He did test positive for chlamydia.  Patient was treated with Levaquin.  Patient does not have any swelling or discomfort right now.  Suspect the Levaquin should have completely treated his chlamydia but I will give him a short course of doxycycline considering the positive diagnosis.  Patient was given refills of his blood pressure medications.  Stressed the importance of follow-up with a primary care doctor.  Patient also had a question about his metformin.  I did review the metabolic panel from his visit on December Fifth his blood sugar was 106.  Will hold off on refill of his metformin at this time        Final Clinical Impression(s) / ED Diagnoses Final diagnoses:  Hypertension, unspecified type  Left inguinal pain    Rx / DC Orders ED Discharge Orders          Ordered    amLODipine (NORVASC) 10 MG tablet  Daily        05/30/23 1122    lisinopril-hydrochlorothiazide (ZESTORETIC) 10-12.5 MG tablet  Daily        05/30/23 1122    doxycycline (VIBRA-TABS) 100 MG tablet  2 times daily        05/30/23 1122              Linwood Dibbles, MD 05/30/23 1125

## 2023-07-12 ENCOUNTER — Ambulatory Visit: Payer: Medicaid Other | Admitting: Internal Medicine

## 2023-09-19 ENCOUNTER — Other Ambulatory Visit: Payer: Self-pay

## 2023-09-19 ENCOUNTER — Emergency Department (HOSPITAL_COMMUNITY): Admission: EM | Admit: 2023-09-19 | Discharge: 2023-09-19 | Disposition: A | Discharge status: Home / Self Care

## 2023-09-19 ENCOUNTER — Emergency Department (HOSPITAL_COMMUNITY)

## 2023-09-19 ENCOUNTER — Emergency Department (HOSPITAL_COMMUNITY)
Admission: EM | Admit: 2023-09-19 | Discharge: 2023-09-20 | Disposition: A | Discharge status: Home / Self Care | Source: Home / Self Care

## 2023-09-19 DIAGNOSIS — M25461 Effusion, right knee: Secondary | ICD-10-CM | POA: Diagnosis not present

## 2023-09-19 DIAGNOSIS — Z5321 Procedure and treatment not carried out due to patient leaving prior to being seen by health care provider: Secondary | ICD-10-CM | POA: Insufficient documentation

## 2023-09-19 DIAGNOSIS — M25561 Pain in right knee: Secondary | ICD-10-CM | POA: Diagnosis not present

## 2023-09-19 DIAGNOSIS — Z79899 Other long term (current) drug therapy: Secondary | ICD-10-CM | POA: Insufficient documentation

## 2023-09-19 DIAGNOSIS — I10 Essential (primary) hypertension: Secondary | ICD-10-CM | POA: Insufficient documentation

## 2023-09-19 DIAGNOSIS — R61 Generalized hyperhidrosis: Secondary | ICD-10-CM | POA: Diagnosis not present

## 2023-09-19 LAB — CBC WITH DIFFERENTIAL/PLATELET
Abs Immature Granulocytes: 0.01 10*3/uL (ref 0.00–0.07)
Basophils Absolute: 0.1 10*3/uL (ref 0.0–0.1)
Basophils Relative: 1 %
Eosinophils Absolute: 0.1 10*3/uL (ref 0.0–0.5)
Eosinophils Relative: 2 %
HCT: 41.4 % (ref 39.0–52.0)
Hemoglobin: 13.8 g/dL (ref 13.0–17.0)
Immature Granulocytes: 0 %
Lymphocytes Relative: 51 %
Lymphs Abs: 4 10*3/uL (ref 0.7–4.0)
MCH: 30.9 pg (ref 26.0–34.0)
MCHC: 33.3 g/dL (ref 30.0–36.0)
MCV: 92.8 fL (ref 80.0–100.0)
Monocytes Absolute: 0.4 10*3/uL (ref 0.1–1.0)
Monocytes Relative: 6 %
Neutro Abs: 3 10*3/uL (ref 1.7–7.7)
Neutrophils Relative %: 40 %
Platelets: 288 10*3/uL (ref 150–400)
RBC: 4.46 MIL/uL (ref 4.22–5.81)
RDW: 12.7 % (ref 11.5–15.5)
WBC: 7.7 10*3/uL (ref 4.0–10.5)
nRBC: 0 % (ref 0.0–0.2)

## 2023-09-19 LAB — BASIC METABOLIC PANEL WITH GFR
Anion gap: 8 (ref 5–15)
BUN: 13 mg/dL (ref 6–20)
CO2: 25 mmol/L (ref 22–32)
Calcium: 9.4 mg/dL (ref 8.9–10.3)
Chloride: 105 mmol/L (ref 98–111)
Creatinine, Ser: 0.89 mg/dL (ref 0.61–1.24)
GFR, Estimated: >60 mL/min (ref >60)
Glucose, Bld: 86 mg/dL (ref 70–99)
Potassium: 3.7 mmol/L (ref 3.5–5.1)
Sodium: 138 mmol/L (ref 135–145)

## 2023-09-19 LAB — I-STAT CHEM 8, ED
BUN: 12 mg/dL (ref 6–20)
Calcium, Ion: 1.21 mmol/L (ref 1.15–1.40)
Chloride: 105 mmol/L (ref 98–111)
Creatinine, Ser: 1 mg/dL (ref 0.61–1.24)
Glucose, Bld: 80 mg/dL (ref 70–99)
HCT: 42 % (ref 39.0–52.0)
Hemoglobin: 14.3 g/dL (ref 13.0–17.0)
Potassium: 3.8 mmol/L (ref 3.5–5.1)
Sodium: 142 mmol/L (ref 135–145)
TCO2: 25 mmol/L (ref 22–32)

## 2023-09-19 MED ORDER — PREDNISONE 20 MG PO TABS
60.0000 mg | ORAL_TABLET | Freq: Once | ORAL | Status: AC
  Administered 2023-09-19: 60 mg via ORAL
  Filled 2023-09-19: qty 3

## 2023-09-19 MED ORDER — LISINOPRIL 10 MG PO TABS
10.0000 mg | ORAL_TABLET | Freq: Once | ORAL | Status: AC
  Administered 2023-09-19: 10 mg via ORAL
  Filled 2023-09-19: qty 1

## 2023-09-19 MED ORDER — KETOROLAC TROMETHAMINE 15 MG/ML IJ SOLN
15.0000 mg | Freq: Once | INTRAMUSCULAR | Status: AC
  Administered 2023-09-19: 15 mg via INTRAMUSCULAR
  Filled 2023-09-19: qty 1

## 2023-09-19 MED ORDER — FENTANYL CITRATE PF 50 MCG/ML IJ SOSY: 50.0000 ug | PREFILLED_SYRINGE | Freq: Once | INTRAMUSCULAR | Status: DC

## 2023-09-19 MED ORDER — FENTANYL CITRATE PF 50 MCG/ML IJ SOSY
50.0000 ug | PREFILLED_SYRINGE | Freq: Once | INTRAMUSCULAR | Status: AC
  Administered 2023-09-19: 50 ug via INTRAMUSCULAR
  Filled 2023-09-19: qty 1

## 2023-09-19 NOTE — ED Triage Notes (Signed)
 Pt c/o severe right knee pain that began today, no injury reported. Pt was at [facility] ED, but left to come here because they reportedly would not give him pain medicine before he went to xray, and he was unable to straighten his leg out for the xray.

## 2023-09-19 NOTE — ED Provider Notes (Signed)
 [place] EMERGENCY DEPARTMENT AT [facility] Provider Note   CSN: [id number] Arrival date & time: 09/19/23  1948     History {Add pertinent medical, surgical, social history, OB history to HPI:1} Chief Complaint  Patient presents with   Knee Pain    Brandon Esparza is a 38 y.o. male with history of hypertension presents with complaints of right knee pain.  Pain began suddenly earlier today.  Denies any injury or trauma.  He states he cannot extend his knee at all due to the pain.  Was seen at [facility], but ended up leaving because he could not tolerate extended his knee for the x-ray.  No numbness or tingling.  No history of gout.  Noted to be hypertensive, states he did not take his blood pressure medication today.  Denies any chest pain, shortness of breath, blurry vision or dizziness.   Knee Pain      Home Medications Prior to Admission medications   Medication Sig Start Date End Date Taking? Authorizing Provider  amLODipine  (NORVASC ) 10 MG tablet Take 1 tablet (10 mg total) by mouth daily. 05/30/23   Trish Furl, MD  benzonatate  (TESSALON ) 100 MG capsule Take 2 capsules (200 mg total) by mouth 3 (three) times daily as needed. 05/17/15   Idol, Julie, PA-C  cephALEXin  (KEFLEX ) 500 MG capsule Take 1 capsule (500 mg total) by mouth 3 (three) times daily. 02/23/23   Orvilla Blander, MD  ibuprofen  (ADVIL ) 800 MG tablet Take 1 tablet (800 mg total) by mouth every 8 (eight) hours as needed for moderate pain. 09/20/22   Cheyenne Cotta, MD  lisinopril -hydrochlorothiazide  (ZESTORETIC ) 10-12.5 MG tablet Take 1 tablet by mouth daily. 05/30/23   Trish Furl, MD  metFORMIN  (GLUCOPHAGE ) 500 MG tablet Take 1 tablet (500 mg total) by mouth 2 (two) times daily with a meal. 05/11/22   Pollina, Marine Sia, MD  oseltamivir  (TAMIFLU ) 75 MG capsule Take 1 capsule (75 mg total) by mouth every 12 (twelve) hours. 05/11/22   Pollina, Christopher J, MD  predniSONE  (DELTASONE ) 10 MG tablet Take 2 tablets  (20 mg total) by mouth 2 (two) times daily with a meal. 02/23/23   Orvilla Blander, MD      Allergies    Patient has no known allergies.    Review of Systems   Review of Systems  Musculoskeletal:  Positive for arthralgias.    Physical Exam Updated Vital Signs BP (!) 205/129   Pulse 76   Temp 98.5 F (36.9 C)   Resp 18   SpO2 99%  Physical Exam Vitals and nursing note reviewed.  Constitutional:      General: He is not in acute distress.    Appearance: He is well-developed.  HENT:     Head: Normocephalic and atraumatic.  Eyes:     Conjunctiva/sclera: Conjunctivae normal.  Cardiovascular:     Rate and Rhythm: Normal rate and regular rhythm.     Heart sounds: No murmur heard. Pulmonary:     Effort: Pulmonary effort is normal. No respiratory distress.     Breath sounds: Normal breath sounds.  Musculoskeletal:        General: No swelling.     Cervical back: Neck supple.     Comments: Right knee with no focal tenderness, no swelling, ecchymosis, gross deformities, is not warm to touch, maintains extremity and flex position does not tolerate any extension, sensation intact, negative Homans, PT pulses 2+  Skin:    General: Skin is warm and dry.  Capillary Refill: Capillary refill takes less than 2 seconds.  Neurological:     General: No focal deficit present.     Mental Status: He is alert.  Psychiatric:        Mood and Affect: Mood normal.     ED Results / Procedures / Treatments   Labs (all labs ordered are listed, but only abnormal results are displayed) Labs Reviewed - No data to display  EKG None  Radiology No results found.  Procedures Procedures  {Document cardiac monitor, telemetry assessment procedure when appropriate:1}  Medications Ordered in ED Medications - No data to display  ED Course/ Medical Decision Making/ A&P   {   Click here for ABCD2, HEART and other calculatorsREFRESH Note before signing :1}                              Medical  Decision Making  This patient presents to the ED with chief complaint(s) of knee pain.  The complaint involves an extensive differential diagnosis and also carries with it a high risk of complications and morbidity.   Pertinent past medical history as listed in HPI  The differential diagnosis includes  Septic joint, gout, fracture, dislocation, sprain, bursitis Additional history obtained: Records reviewed Care Everywhere/External Records  Assessment and management:   Patient presents hypertensive to 205/129 with complaints of atraumatic right knee pain that started suddenly today.  Not associated with numbness or tingling.  No prior injuries.  No history of blood clots or gout.  Was seen at [facility] earlier today but left because he could not straighten his knee.  On exam he maintains his knee in a flexed position and refuses to extend his knee at all due to the pain.  The has no focal tenderness and there is no swelling, increased warmth.  Negative Homans, PT pulses 2+.  Will trial shot of fentanyl for patient to extend his knee for the x-ray.  Of note patient did forget to take his lisinopril  today.  Is not associated with any chest pain, shortness of breath, blurry vision, dizziness, extremity numbness or weakness.  Independent ECG interpretation:  none  Independent labs interpretation:  The following labs were independently interpreted:  none  Independent visualization and interpretation of imaging: I independently visualized the following imaging with scope of interpretation limited to determining acute life threatening conditions related to emergency care: ***    Consultations obtained:   ***  Disposition:   ***  Social Determinants of Health:   Patient's {ZOXW:[id number]}  increases the complexity of managing their presentation  This note was dictated with voice recognition software.  Despite best efforts at proofreading, errors may have occurred which can change the  documentation meaning.    {Document critical care time when appropriate:1} {Document review of labs and clinical decision tools ie heart score, Chads2Vasc2 etc:1}  {Document your independent review of radiology images, and any outside records:1} {Document your discussion with family members, caretakers, and with consultants:1} {Document social determinants of health affecting pt's care:1} {Document your decision making why or why not admission, treatments were needed:1} Final Clinical Impression(s) / ED Diagnoses Final diagnoses:  None    Rx / DC Orders ED Discharge Orders     None

## 2023-09-19 NOTE — ED Triage Notes (Signed)
 Pt arrived via [facility] from work reporting sudden onset of right knee pain. Pt denies injury. Pt reports he works on his feet all day at work in [employer]. Pt reports he sat down to look at his phone and pain suddenly came out of no where. Pt reports a long time ago, he heard once he might have Bursitis in his knees.

## 2023-09-19 NOTE — Discharge Instructions (Addendum)
 You were evaluated in the emergency room for knee pain.  Your x-ray was consistent with arthritis.  A prescription for prednisone  was sent into your pharmacy.  You have been provided a referral to see orthopedics.  Please call and make an appointment.  You additionally noted to be hypertensive during your visit.  Please be sure to resume your blood pressure medication and follow-up with your primary care doctor.

## 2023-09-19 NOTE — ED Provider Notes (Incomplete)
  EMERGENCY DEPARTMENT AT [facility] Provider Note   CSN: [id number] Arrival date & time: 09/19/23  1948     History {Add pertinent medical, surgical, social history, OB history to HPI:1} Chief Complaint  Patient presents with  . Knee Pain    Brandon Esparza is a 38 y.o. male with history of hypertension presents with complaints of right knee pain.  Pain began suddenly earlier today.  Denies any injury or trauma.  He states he cannot extend his knee at all due to the pain.  Was seen at [facility], but ended up leaving because he could not tolerate extended his knee for the x-ray.  No numbness or tingling.  No history of gout.  Noted to be hypertensive, states he did not take his blood pressure medication today.  Denies any chest pain, shortness of breath, blurry vision or dizziness.   Knee Pain      Home Medications Prior to Admission medications   Medication Sig Start Date End Date Taking? Authorizing Provider  amLODipine  (NORVASC ) 10 MG tablet Take 1 tablet (10 mg total) by mouth daily. 05/30/23   Trish Furl, MD  benzonatate  (TESSALON ) 100 MG capsule Take 2 capsules (200 mg total) by mouth 3 (three) times daily as needed. 05/17/15   Idol, Julie, PA-C  cephALEXin  (KEFLEX ) 500 MG capsule Take 1 capsule (500 mg total) by mouth 3 (three) times daily. 02/23/23   Orvilla Blander, MD  ibuprofen  (ADVIL ) 800 MG tablet Take 1 tablet (800 mg total) by mouth every 8 (eight) hours as needed for moderate pain. 09/20/22   Cheyenne Cotta, MD  lisinopril -hydrochlorothiazide  (ZESTORETIC ) 10-12.5 MG tablet Take 1 tablet by mouth daily. 05/30/23   Trish Furl, MD  metFORMIN  (GLUCOPHAGE ) 500 MG tablet Take 1 tablet (500 mg total) by mouth 2 (two) times daily with a meal. 05/11/22   Pollina, Marine Sia, MD  oseltamivir  (TAMIFLU ) 75 MG capsule Take 1 capsule (75 mg total) by mouth every 12 (twelve) hours. 05/11/22   Ballard Bongo, MD  predniSONE  (DELTASONE ) 10 MG tablet Take 2  tablets (20 mg total) by mouth 2 (two) times daily with a meal. 02/23/23   Orvilla Blander, MD      Allergies    Patient has no known allergies.    Review of Systems   Review of Systems  Musculoskeletal:  Positive for arthralgias.    Physical Exam Updated Vital Signs BP (!) 205/129   Pulse 76   Temp 98.5 F (36.9 C)   Resp 18   SpO2 99%  Physical Exam Vitals and nursing note reviewed.  Constitutional:      General: He is not in acute distress.    Appearance: He is well-developed.  HENT:     Head: Normocephalic and atraumatic.  Eyes:     Conjunctiva/sclera: Conjunctivae normal.  Cardiovascular:     Rate and Rhythm: Normal rate and regular rhythm.     Heart sounds: No murmur heard. Pulmonary:     Effort: Pulmonary effort is normal. No respiratory distress.     Breath sounds: Normal breath sounds.  Musculoskeletal:        General: No swelling.     Cervical back: Neck supple.     Comments: Right knee with no focal tenderness, no swelling, ecchymosis, gross deformities, is not warm to touch, maintains extremity and flex position does not tolerate any extension, sensation intact, negative Homans, PT pulses 2+  Skin:    General: Skin is warm and dry.  Capillary Refill: Capillary refill takes less than 2 seconds.  Neurological:     General: No focal deficit present.     Mental Status: He is alert.  Psychiatric:        Mood and Affect: Mood normal.     ED Results / Procedures / Treatments   Labs (all labs ordered are listed, but only abnormal results are displayed) Labs Reviewed - No data to display  EKG None  Radiology DG Knee 2 Views Right Result Date: 09/19/2023 CLINICAL DATA:  Sudden onset right knee pain.  Denies injury. EXAM: RIGHT KNEE - 1-2 VIEW COMPARISON:  None Available. FINDINGS: Three views. Positioning is suboptimal. Apparently the patient was unable to straighten the extremity. There is no evidence of fracture or dislocation. There is at least a  partial joint space loss in the medial femorotibial compartment. The second image demonstrates a widening of the lateral femorotibial joint which could be due to ligamentous injury or laxity. There is slight joint space loss in the patellofemoral joint. There are prominent femorotibial and small patellofemoral marginal osteophytes, age-advanced. There is a normal variant fabella. No focal pathologic bone lesion is seen. There is a small suprapatellar bursal effusion. Soft tissues are otherwise unremarkable. IMPRESSION: 1. No evidence of fracture or dislocation. 2. Age-advanced degenerative changes. 3. Widening of the lateral femorotibial joint on the second image which could be due to ligamentous injury or laxity. 4. Small suprapatellar bursal effusion. Electronically Signed   By: Denman Fischer M.D.   On: 09/19/2023 23:01    Procedures Procedures  {Document cardiac monitor, telemetry assessment procedure when appropriate:1}  Medications Ordered in ED Medications  lisinopril  (ZESTRIL ) tablet 10 mg (has no administration in time range)  ketorolac  (TORADOL ) 15 MG/ML injection 15 mg (has no administration in time range)  predniSONE  (DELTASONE ) tablet 60 mg (has no administration in time range)  fentaNYL (SUBLIMAZE) injection 50 mcg (50 mcg Intramuscular Given 09/19/23 2235)    ED Course/ Medical Decision Making/ A&P   {   Click here for ABCD2, HEART and other calculatorsREFRESH Note before signing :1}                              Medical Decision Making Amount and/or Complexity of Data Reviewed Radiology: ordered.  Risk Prescription drug management.   This patient presents to the ED with chief complaint(s) of knee pain.  The complaint involves an extensive differential diagnosis and also carries with it a high risk of complications and morbidity.   Pertinent past medical history as listed in HPI  The differential diagnosis includes  Septic joint, gout, fracture, dislocation, sprain,  bursitis Additional history obtained: Records reviewed Care Everywhere/External Records  Assessment and management:   Patient presents hypertensive to 205/129 with complaints of atraumatic right knee pain that started suddenly today.  Not associated with numbness or tingling.  No prior injuries.  No history of blood clots or gout.  Was seen at [facility] earlier today but left because he could not straighten his knee.  On exam he maintains his knee in a flexed position and refuses to extend his knee at all due to the pain.  The has no focal tenderness and there is no swelling, increased warmth.  Negative Homans, PT pulses 2+.  Will trial shot of fentanyl for patient to extend his knee for the x-ray.  Of note patient did forget to take his lisinopril  today.  Is not associated with  any chest pain, shortness of breath, blurry vision, dizziness, extremity numbness or weakness.  X-rays consistent with arthritic changes, so does not tolerate extension of the knee, he is afebrile without any swelling or erythema.  Pulses are intact.  Do not suspect septic joint particularly given the acute onset of the pain.  Low suspicion for gout as well based off physical exam.  He is insistent on going home.  Will give a shot of Toradol  and send him prednisone  and have him follow-up with Ortho.  Independent ECG interpretation:  none  Independent labs interpretation:  The following labs were independently interpreted:  none  Independent visualization and interpretation of imaging: I independently visualized the following imaging with scope of interpretation limited to determining acute life threatening conditions related to emergency care:  X-ray with arthritic changes, no acute abnormality   Consultations obtained:   none  Disposition:   Patient will be discharged home. The patient has been appropriately medically screened and/or stabilized in the ED. I have low suspicion for any other emergent medical  condition which would require further screening, evaluation or treatment in the ED or require inpatient management. At time of discharge the patient is hemodynamically stable and in no acute distress. I have discussed work-up results and diagnosis with patient and answered all questions. Patient is agreeable with discharge plan. We discussed strict return precautions for returning to the emergency department and they verbalized understanding.     Social Determinants of Health:   none  This note was dictated with voice recognition software.  Despite best efforts at proofreading, errors may have occurred which can change the documentation meaning.    {Document critical care time when appropriate:1} {Document review of labs and clinical decision tools ie heart score, Chads2Vasc2 etc:1}  {Document your independent review of radiology images, and any outside records:1} {Document your discussion with family members, caretakers, and with consultants:1} {Document social determinants of health affecting pt's care:1} {Document your decision making why or why not admission, treatments were needed:1} Final Clinical Impression(s) / ED Diagnoses Final diagnoses:  Acute pain of right knee    Rx / DC Orders ED Discharge Orders     None

## 2023-09-20 MED ORDER — PREDNISONE 10 MG (21) PO TBPK: ORAL_TABLET | Freq: Every day | ORAL | 0 refills | Status: DC

## 2023-09-20 NOTE — ED Notes (Signed)
Pt provided with crutches and educated on use.

## 2023-09-24 ENCOUNTER — Encounter (HOSPITAL_BASED_OUTPATIENT_CLINIC_OR_DEPARTMENT_OTHER): Payer: Self-pay | Admitting: Emergency Medicine

## 2023-09-24 ENCOUNTER — Emergency Department (HOSPITAL_BASED_OUTPATIENT_CLINIC_OR_DEPARTMENT_OTHER)
Admission: EM | Admit: 2023-09-24 | Discharge: 2023-09-24 | Disposition: A | Discharge status: Home / Self Care | Attending: Emergency Medicine | Admitting: Emergency Medicine

## 2023-09-24 ENCOUNTER — Emergency Department (HOSPITAL_BASED_OUTPATIENT_CLINIC_OR_DEPARTMENT_OTHER)

## 2023-09-24 ENCOUNTER — Other Ambulatory Visit: Payer: Self-pay

## 2023-09-24 DIAGNOSIS — N50812 Left testicular pain: Secondary | ICD-10-CM | POA: Diagnosis not present

## 2023-09-24 DIAGNOSIS — I10 Essential (primary) hypertension: Secondary | ICD-10-CM | POA: Diagnosis not present

## 2023-09-24 DIAGNOSIS — N433 Hydrocele, unspecified: Secondary | ICD-10-CM | POA: Diagnosis not present

## 2023-09-24 DIAGNOSIS — Z79899 Other long term (current) drug therapy: Secondary | ICD-10-CM | POA: Diagnosis not present

## 2023-09-24 DIAGNOSIS — N503 Cyst of epididymis: Secondary | ICD-10-CM | POA: Diagnosis not present

## 2023-09-24 LAB — URINALYSIS, ROUTINE W REFLEX MICROSCOPIC
Bacteria, UA: NONE SEEN
Bilirubin Urine: NEGATIVE
Glucose, UA: NEGATIVE mg/dL
Hgb urine dipstick: NEGATIVE
Ketones, ur: NEGATIVE mg/dL
Leukocytes,Ua: NEGATIVE
Nitrite: NEGATIVE
Protein, ur: NEGATIVE mg/dL
Specific Gravity, Urine: 1.026 (ref 1.005–1.030)
pH: 6.5 (ref 5.0–8.0)

## 2023-09-24 MED ORDER — AMLODIPINE BESYLATE 5 MG PO TABS
10.0000 mg | ORAL_TABLET | Freq: Once | ORAL | Status: AC
  Administered 2023-09-24: 10 mg via ORAL
  Filled 2023-09-24: qty 2

## 2023-09-24 MED ORDER — AMLODIPINE BESYLATE 10 MG PO TABS: 10.0000 mg | ORAL_TABLET | Freq: Every day | ORAL | 2 refills | Status: AC

## 2023-09-24 MED ORDER — LISINOPRIL-HYDROCHLOROTHIAZIDE 10-12.5 MG PO TABS: 1.0000 | ORAL_TABLET | Freq: Every day | ORAL | 2 refills | Status: DC

## 2023-09-24 MED ORDER — OXYCODONE-ACETAMINOPHEN 5-325 MG PO TABS
1.0000 | ORAL_TABLET | Freq: Once | ORAL | Status: AC
  Administered 2023-09-24: 1 via ORAL
  Filled 2023-09-24: qty 1

## 2023-09-24 MED ORDER — HYDROCODONE-ACETAMINOPHEN 5-325 MG PO TABS: 1.0000 | ORAL_TABLET | ORAL | 0 refills | Status: DC | PRN

## 2023-09-24 NOTE — Discharge Instructions (Addendum)
 As we discussed, the very small area seen in the left testicle needs to be re-ultrasounded in 3 months for better characterization. Follow up with [facility] if you have any further testicular pain as well as for this repeat ultrasound.   We also talked about the importance of taking your blood pressure medication everyday. Prescriptions for both medications were sent to your pharmacy, including 2 refills which should give you a total of 3 months of medication. This should provide enough time to make an appointment with your primary care clinic and continue care.

## 2023-09-24 NOTE — ED Triage Notes (Signed)
 Left groin pain. Started today around 11AM  Denies known injury

## 2023-09-24 NOTE — ED Provider Notes (Addendum)
 [place] EMERGENCY DEPARTMENT AT [facility] Provider Note   CSN: [id number] Arrival date & time: 09/24/23  1343     History  Chief Complaint  Patient presents with   Groin Pain    Brandon Esparza is a 38 y.o. male.  Patient to ED with left testicular pain that started this morning. NO injury or trauma. History of epididymitis in the past (right). No difficulty urinating, fever, nausea, penile discharge.   The history is provided by the patient. No language interpreter was used.  Groin Pain       Home Medications Prior to Admission medications   Medication Sig Start Date End Date Taking? Authorizing Provider  amLODipine  (NORVASC ) 10 MG tablet Take 1 tablet (10 mg total) by mouth daily. 09/24/23   Mandy Second, PA-C  benzonatate  (TESSALON ) 100 MG capsule Take 2 capsules (200 mg total) by mouth 3 (three) times daily as needed. 05/17/15   Idol, Julie, PA-C  cephALEXin  (KEFLEX ) 500 MG capsule Take 1 capsule (500 mg total) by mouth 3 (three) times daily. 02/23/23   Orvilla Blander, MD  ibuprofen  (ADVIL ) 800 MG tablet Take 1 tablet (800 mg total) by mouth every 8 (eight) hours as needed for moderate pain. 09/20/22   Zammit, Joseph, MD  lisinopril -hydrochlorothiazide  (ZESTORETIC ) 10-12.5 MG tablet Take 1 tablet by mouth daily. 09/24/23   Mandy Second, PA-C  metFORMIN  (GLUCOPHAGE ) 500 MG tablet Take 1 tablet (500 mg total) by mouth 2 (two) times daily with a meal. 05/11/22   Pollina, Marine Sia, MD  oseltamivir  (TAMIFLU ) 75 MG capsule Take 1 capsule (75 mg total) by mouth every 12 (twelve) hours. 05/11/22   Ballard Bongo, MD  predniSONE  (DELTASONE ) 10 MG tablet Take 2 tablets (20 mg total) by mouth 2 (two) times daily with a meal. 02/23/23   Delo, Dufm Gibbon, MD  predniSONE  (STERAPRED UNI-PAK 21 TAB) 10 MG (21) TBPK tablet Take by mouth daily. Take 6 tabs by mouth daily  for 2 days, then 5 tabs for 2 days, then 4 tabs for 2 days, then 3 tabs for 2 days, 2 tabs for 2 days,  then 1 tab by mouth daily for 2 days 09/19/23   Felicie Horning, PA-C      Allergies    Patient has no known allergies.    Review of Systems   Review of Systems  Physical Exam Updated Vital Signs BP (!) 162/108   Pulse 81   Temp 98.7 F (37.1 C)   Resp 18   SpO2 96%  Physical Exam Vitals and nursing note reviewed.  Constitutional:      Appearance: He is obese.     Comments: Uncomfortable appearing  Abdominal:     Palpations: Abdomen is soft.  Genitourinary:    Comments: Uncircumcised penis without discharge. Right testicle nontender. Left testicular tenderness without swelling. No redness. No hernia appreciated Neurological:     Mental Status: He is alert.     ED Results / Procedures / Treatments   Labs (all labs ordered are listed, but only abnormal results are displayed) Labs Reviewed  URINALYSIS, ROUTINE W REFLEX MICROSCOPIC  GC/CHLAMYDIA PROBE AMP () NOT AT [facility]    EKG None  Radiology US  Scrotum Result Date: 09/24/2023 CLINICAL DATA:  Left testicular pain. EXAM: SCROTAL ULTRASOUND DOPPLER ULTRASOUND OF THE TESTICLES TECHNIQUE: Complete ultrasound examination of the testicles, epididymis, and other scrotal structures was performed. Color and spectral Doppler ultrasound were also utilized to evaluate blood flow to the testicles. COMPARISON:  None  Available. FINDINGS: Right testicle Measurements: 4.4 x 2.3 x 3.0 cm. No mass or microlithiasis visualized. Left testicle Measurements: 3.9 x 2.1 x 3.3 cm. No bilateral thighs cyst. There is a nonshadowing echogenic focus in the mid testicle measuring 3 x 2 x 3 mm. Right epididymis: Epididymal head cyst present measuring 1.0 x 0.7 x 1.3 cm. Otherwise within normal limits. Left epididymis: Multiple cystic areas present. Prominent cyst measures 2.1 x 1.4 x 2.0 cm. Other measures 9 x 6 x 10 mm. Normal vascularity. Hydrocele:  Septated bilateral hydroceles are present. Varicocele:  None visualized. Pulsed Doppler  interrogation of both testes demonstrates normal low resistance arterial and venous waveforms bilaterally. IMPRESSION: 1. No evidence of testicular torsion. 2. Bilateral epididymal cysts. 3. Septated bilateral hydroceles. 4. Nonshadowing echogenic focus in the mid left testicle measuring 3 mm. This is nonspecific and may represent a small lipoma. Other etiologies are not excluded. This can be followed up in 3 months with ultrasound to confirm stability or definitively evaluated with MRI. Electronically Signed   By: Tyron Gallon M.D.   On: 09/24/2023 16:15    Procedures Procedures    Medications Ordered in ED Medications  oxyCODONE -acetaminophen  (PERCOCET/ROXICET) 5-325 MG per tablet 1 tablet (1 tablet Oral Given 09/24/23 1423)  amLODipine  (NORVASC ) tablet 10 mg (10 mg Oral Given 09/24/23 1648)    ED Course/ Medical Decision Making/ A&P                                 Medical Decision Making This patient presents to the ED for concern of testicle pain, this involves an extensive number of treatment options, and is a complaint that carries with it a high risk of complications and morbidity.  The differential diagnosis includes torsion, STD, hernia, infection/abscess, epididymitis   Co morbidities that complicate the patient evaluation  History of epididymitis in the past, HTN, T2DM   Additional history obtained:  Additional history and/or information obtained from chart review, notable for Previous scrotal US : 04/11/2023 IMPRESSION: 1. No evidence of testicular torsion or mass. 2. Enlarged heterogeneous right epididymis without definite increased blood flow. This may reflect epididymitis. 3. Prominent left epididymal head cyst measuring 2 cm.    Lab Tests:  I Ordered, and personally interpreted labs.  The pertinent results include:   UA GC/chlamydia (urine) - pending    Imaging Studies ordered:  I ordered imaging studies including scrotal US  Per radiologist  interpretation:  IMPRESSION: 1. No evidence of testicular torsion. 2. Bilateral epididymal cysts. 3. Septated bilateral hydroceles. 4. Nonshadowing echogenic focus in the mid left testicle measuring 3 mm. This is nonspecific and may represent a small lipoma. Other etiologies are not excluded. This can be followed up in 3 months with ultrasound to confirm stability or definitively evaluated with MRI.   Cardiac Monitoring:  The patient was maintained on a cardiac monitor.  I personally viewed and interpreted the cardiac monitored which showed an underlying rhythm of: n/a   Medicines ordered and prescription drug management:  I ordered medication including percocet  for pain Reevaluation of the patient after these medicines showed that the patient improved I have reviewed the patients home medicines and have made adjustments as needed   Test Considered:  N/a   Critical Interventions:  N/a   Consultations Obtained:  I requested consultation with the n/a,  and discussed lab and imaging findings as well as pertinent plan - they recommend: n/a  Problem List / ED Course:  Onset left testicle pain this morning. No difficulty urinating US  does not show testicular torsion, no epididymitis. UA negative. GC/chlamydia pending The patient's blood pressure has been significantly elevated in the department. He reports he does not take his blood pressure medication as prescribed. He does not have a regular PCP.  214/142, 199/130, 162/108 He is unable to confirm which medications he takes. Reviewed prescription history with [facility] where he states he obtains his medications. They confirm the last fill of Norvasc  10 mg and Zestoretic  10mg /12.5mg , both for 30 day duration, in January, suggesting he is not taking these medications regularly.  Norvasc  provided in the ED. Strongly encouraged PCP follow up (will refer), discussed importance of blood pressure control. Discussed lifestyle  changes including smoking cessation and weight loss.  US  of testicle shows a small, likely lipoma. Recommendation is repeat US  in 3 months vs MRI today for further clarification. This is not felt to be the source of his pain this morning. The patient opts to have repeat imaging in 3 months.  He reports his pain is better. Will provide urology referral if pain persists/recurs; referral to PCP; Rx Zestoretic  and Norvasc .  Norco provided for pain  Reevaluation:  After the interventions noted above, I reevaluated the patient and found that they have :improved   Social Determinants of Health:  Continuous smoker   Disposition:  After consideration of the diagnostic results and the patients response to treatment, I feel that the patient would benefit from discharge home..   Amount and/or Complexity of Data Reviewed Labs: ordered. Radiology: ordered.  Risk Prescription drug management.           Final Clinical Impression(s) / ED Diagnoses Final diagnoses:  Pain in left testicle  Hypertension, unspecified type    Rx / DC Orders ED Discharge Orders          Ordered    lisinopril -hydrochlorothiazide  (ZESTORETIC ) 10-12.5 MG tablet  Daily        09/24/23 1705    amLODipine  (NORVASC ) 10 MG tablet  Daily        09/24/23 1705              Mandy Second, PA-C 09/24/23 1716    Mandy Second, PA-C 09/24/23 1747    Sueellen Emery, MD 09/27/23 [phone]

## 2023-09-25 ENCOUNTER — Emergency Department (HOSPITAL_COMMUNITY)

## 2023-09-25 ENCOUNTER — Encounter (HOSPITAL_COMMUNITY): Payer: Self-pay

## 2023-09-25 ENCOUNTER — Other Ambulatory Visit: Payer: Self-pay

## 2023-09-25 ENCOUNTER — Emergency Department (HOSPITAL_COMMUNITY)
Admission: EM | Admit: 2023-09-25 | Discharge: 2023-09-25 | Disposition: A | Discharge status: Home / Self Care | Attending: Emergency Medicine | Admitting: Emergency Medicine

## 2023-09-25 DIAGNOSIS — N433 Hydrocele, unspecified: Secondary | ICD-10-CM | POA: Diagnosis not present

## 2023-09-25 DIAGNOSIS — N451 Epididymitis: Secondary | ICD-10-CM | POA: Diagnosis not present

## 2023-09-25 DIAGNOSIS — Z79899 Other long term (current) drug therapy: Secondary | ICD-10-CM | POA: Insufficient documentation

## 2023-09-25 DIAGNOSIS — N50819 Testicular pain, unspecified: Secondary | ICD-10-CM

## 2023-09-25 DIAGNOSIS — R109 Unspecified abdominal pain: Secondary | ICD-10-CM | POA: Diagnosis not present

## 2023-09-25 DIAGNOSIS — I10 Essential (primary) hypertension: Secondary | ICD-10-CM | POA: Diagnosis not present

## 2023-09-25 DIAGNOSIS — N50812 Left testicular pain: Secondary | ICD-10-CM | POA: Insufficient documentation

## 2023-09-25 DIAGNOSIS — I160 Hypertensive urgency: Secondary | ICD-10-CM | POA: Insufficient documentation

## 2023-09-25 LAB — GC/CHLAMYDIA PROBE AMP (~~LOC~~) NOT AT ARMC
Chlamydia: NEGATIVE
Comment: NEGATIVE
Comment: NORMAL
Neisseria Gonorrhea: NEGATIVE

## 2023-09-25 LAB — CBC WITH DIFFERENTIAL/PLATELET
Abs Immature Granulocytes: 0.03 10*3/uL (ref 0.00–0.07)
Basophils Absolute: 0 10*3/uL (ref 0.0–0.1)
Basophils Relative: 0 %
Eosinophils Absolute: 0.2 10*3/uL (ref 0.0–0.5)
Eosinophils Relative: 2 %
HCT: 43.8 % (ref 39.0–52.0)
Hemoglobin: 14.2 g/dL (ref 13.0–17.0)
Immature Granulocytes: 0 %
Lymphocytes Relative: 35 %
Lymphs Abs: 3.5 10*3/uL (ref 0.7–4.0)
MCH: 30 pg (ref 26.0–34.0)
MCHC: 32.4 g/dL (ref 30.0–36.0)
MCV: 92.6 fL (ref 80.0–100.0)
Monocytes Absolute: 0.6 10*3/uL (ref 0.1–1.0)
Monocytes Relative: 6 %
Neutro Abs: 5.6 10*3/uL (ref 1.7–7.7)
Neutrophils Relative %: 57 %
Platelets: 260 10*3/uL (ref 150–400)
RBC: 4.73 MIL/uL (ref 4.22–5.81)
RDW: 12.4 % (ref 11.5–15.5)
WBC: 10 10*3/uL (ref 4.0–10.5)
nRBC: 0 % (ref 0.0–0.2)

## 2023-09-25 LAB — URINALYSIS, W/ REFLEX TO CULTURE (INFECTION SUSPECTED)
Bacteria, UA: NONE SEEN
Bilirubin Urine: NEGATIVE
Glucose, UA: NEGATIVE mg/dL
Hgb urine dipstick: NEGATIVE
Ketones, ur: NEGATIVE mg/dL
Leukocytes,Ua: NEGATIVE
Nitrite: NEGATIVE
Protein, ur: NEGATIVE mg/dL
Specific Gravity, Urine: 1.02 (ref 1.005–1.030)
pH: 6 (ref 5.0–8.0)

## 2023-09-25 LAB — BASIC METABOLIC PANEL WITH GFR
Anion gap: 7 (ref 5–15)
BUN: 14 mg/dL (ref 6–20)
CO2: 25 mmol/L (ref 22–32)
Calcium: 9.1 mg/dL (ref 8.9–10.3)
Chloride: 106 mmol/L (ref 98–111)
Creatinine, Ser: 0.78 mg/dL (ref 0.61–1.24)
GFR, Estimated: >60 mL/min (ref >60)
Glucose, Bld: 99 mg/dL (ref 70–99)
Potassium: 3.5 mmol/L (ref 3.5–5.1)
Sodium: 138 mmol/L (ref 135–145)

## 2023-09-25 MED ORDER — HYDROMORPHONE HCL 1 MG/ML IJ SOLN: 1.0000 mg | Freq: Once | INTRAMUSCULAR | Status: DC

## 2023-09-25 MED ORDER — HYDRALAZINE HCL 20 MG/ML IJ SOLN
20.0000 mg | Freq: Once | INTRAMUSCULAR | Status: AC
  Administered 2023-09-25: 20 mg via INTRAVENOUS
  Filled 2023-09-25: qty 1

## 2023-09-25 MED ORDER — LABETALOL HCL 5 MG/ML IV SOLN
20.0000 mg | Freq: Once | INTRAVENOUS | Status: AC
  Administered 2023-09-25: 20 mg via INTRAVENOUS
  Filled 2023-09-25: qty 4

## 2023-09-25 MED ORDER — OXYCODONE-ACETAMINOPHEN 7.5-325 MG PO TABS: 1.0000 | ORAL_TABLET | ORAL | 0 refills | Status: DC | PRN

## 2023-09-25 MED ORDER — HYDROMORPHONE HCL 1 MG/ML IJ SOLN
2.0000 mg | Freq: Once | INTRAMUSCULAR | Status: AC
  Administered 2023-09-25: 2 mg via INTRAVENOUS
  Filled 2023-09-25: qty 2

## 2023-09-25 MED ORDER — MORPHINE SULFATE (PF) 4 MG/ML IV SOLN
4.0000 mg | Freq: Once | INTRAVENOUS | Status: DC
  Filled 2023-09-25: qty 1

## 2023-09-25 MED ORDER — MORPHINE SULFATE (PF) 4 MG/ML IV SOLN
8.0000 mg | Freq: Once | INTRAVENOUS | Status: AC
  Administered 2023-09-25: 8 mg via INTRAVENOUS
  Filled 2023-09-25: qty 2

## 2023-09-25 MED ORDER — ONDANSETRON HCL 4 MG/2ML IJ SOLN
4.0000 mg | Freq: Once | INTRAMUSCULAR | Status: AC
Start: 2023-09-25 — End: 2023-09-25
  Administered 2023-09-25: 4 mg via INTRAVENOUS
  Filled 2023-09-25: qty 2

## 2023-09-25 MED ORDER — HYDRALAZINE HCL 20 MG/ML IJ SOLN
10.0000 mg | Freq: Once | INTRAMUSCULAR | Status: DC
Start: 2023-09-25 — End: 2023-09-25

## 2023-09-25 NOTE — ED Provider Notes (Signed)
 [place] EMERGENCY DEPARTMENT AT [facility] Provider Note   CSN: [id number] Arrival date & time: 09/25/23  1634     History  Chief Complaint  Patient presents with   Groin Pain    Abel Hageman is a 38 y.o. male.  38 year old male presents with return of left sided testicular pain.  Seen yesterday at [facility] and had a negative testicular ultrasound for torsion.  States while today at work he also had acute onset of left testicular pain.  Denies any urinary symptoms.  States pain is sharp and worse with any movement.  He was also diagnosed with hypertension yesterday evening and had been prescribed medications which she has since not filled.  He denies any chest pain       Home Medications Prior to Admission medications   Medication Sig Start Date End Date Taking? Authorizing Provider  amLODipine  (NORVASC ) 10 MG tablet Take 1 tablet (10 mg total) by mouth daily. 09/24/23   Mandy Second, PA-C  benzonatate  (TESSALON ) 100 MG capsule Take 2 capsules (200 mg total) by mouth 3 (three) times daily as needed. 05/17/15   Idol, Julie, PA-C  cephALEXin  (KEFLEX ) 500 MG capsule Take 1 capsule (500 mg total) by mouth 3 (three) times daily. 02/23/23   Orvilla Blander, MD  HYDROcodone -acetaminophen  (NORCO/VICODIN) 5-325 MG tablet Take 1 tablet by mouth every 4 (four) hours as needed. 09/24/23   Mandy Second, PA-C  ibuprofen  (ADVIL ) 800 MG tablet Take 1 tablet (800 mg total) by mouth every 8 (eight) hours as needed for moderate pain. 09/20/22   Cheyenne Cotta, MD  lisinopril -hydrochlorothiazide  (ZESTORETIC ) 10-12.5 MG tablet Take 1 tablet by mouth daily. 09/24/23   Mandy Second, PA-C  metFORMIN  (GLUCOPHAGE ) 500 MG tablet Take 1 tablet (500 mg total) by mouth 2 (two) times daily with a meal. 05/11/22   Pollina, Marine Sia, MD  oseltamivir  (TAMIFLU ) 75 MG capsule Take 1 capsule (75 mg total) by mouth every 12 (twelve) hours. 05/11/22   Ballard Bongo, MD  predniSONE  (DELTASONE )  10 MG tablet Take 2 tablets (20 mg total) by mouth 2 (two) times daily with a meal. 02/23/23   Delo, Dufm Gibbon, MD  predniSONE  (STERAPRED UNI-PAK 21 TAB) 10 MG (21) TBPK tablet Take by mouth daily. Take 6 tabs by mouth daily  for 2 days, then 5 tabs for 2 days, then 4 tabs for 2 days, then 3 tabs for 2 days, 2 tabs for 2 days, then 1 tab by mouth daily for 2 days 09/19/23   Felicie Horning, PA-C      Allergies    Patient has no known allergies.    Review of Systems   Review of Systems  All other systems reviewed and are negative.   Physical Exam Updated Vital Signs BP (!) 259/170 (BP Location: Left Arm)   Pulse 78   Temp 97.8 F (36.6 C) (Oral)   Resp 20   Ht 1.93 m (6\' 4" )   Wt (!) 155 kg   SpO2 100%   BMI 41.59 kg/m  Physical Exam Vitals and nursing note reviewed. Exam conducted with a chaperone present.  Constitutional:      General: He is not in acute distress.    Appearance: Normal appearance. He is well-developed. He is not toxic-appearing.  HENT:     Head: Normocephalic and atraumatic.  Eyes:     General: Lids are normal.     Conjunctiva/sclera: Conjunctivae normal.     Pupils: Pupils are equal, round, and reactive  to light.  Neck:     Thyroid: No thyroid mass.     Trachea: No tracheal deviation.  Cardiovascular:     Rate and Rhythm: Normal rate and regular rhythm.     Heart sounds: Normal heart sounds. No murmur heard.    No gallop.  Pulmonary:     Effort: Pulmonary effort is normal. No respiratory distress.     Breath sounds: Normal breath sounds. No stridor. No decreased breath sounds, wheezing, rhonchi or rales.  Abdominal:     General: There is no distension.     Palpations: Abdomen is soft.     Tenderness: There is no abdominal tenderness. There is no rebound.  Genitourinary:      Comments: No pain in the inguinal canal on the left side.  No CVA tenderness Musculoskeletal:        General: No tenderness. Normal range of motion.     Cervical back:  Normal range of motion and neck supple.  Skin:    General: Skin is warm and dry.     Findings: No abrasion or rash.  Neurological:     Mental Status: He is alert and oriented to person, place, and time. Mental status is at baseline.     GCS: GCS eye subscore is 4. GCS verbal subscore is 5. GCS motor subscore is 6.     Cranial Nerves: No cranial nerve deficit.     Sensory: No sensory deficit.     Motor: Motor function is intact.  Psychiatric:        Attention and Perception: Attention normal.        Speech: Speech normal.        Behavior: Behavior normal.     ED Results / Procedures / Treatments   Labs (all labs ordered are listed, but only abnormal results are displayed) Labs Reviewed  CBC WITH DIFFERENTIAL/PLATELET  BASIC METABOLIC PANEL WITH GFR  URINALYSIS, W/ REFLEX TO CULTURE (INFECTION SUSPECTED)    EKG None  Radiology US  Scrotum Result Date: 09/24/2023 CLINICAL DATA:  Left testicular pain. EXAM: SCROTAL ULTRASOUND DOPPLER ULTRASOUND OF THE TESTICLES TECHNIQUE: Complete ultrasound examination of the testicles, epididymis, and other scrotal structures was performed. Color and spectral Doppler ultrasound were also utilized to evaluate blood flow to the testicles. COMPARISON:  None Available. FINDINGS: Right testicle Measurements: 4.4 x 2.3 x 3.0 cm. No mass or microlithiasis visualized. Left testicle Measurements: 3.9 x 2.1 x 3.3 cm. No bilateral thighs cyst. There is a nonshadowing echogenic focus in the mid testicle measuring 3 x 2 x 3 mm. Right epididymis: Epididymal head cyst present measuring 1.0 x 0.7 x 1.3 cm. Otherwise within normal limits. Left epididymis: Multiple cystic areas present. Prominent cyst measures 2.1 x 1.4 x 2.0 cm. Other measures 9 x 6 x 10 mm. Normal vascularity. Hydrocele:  Septated bilateral hydroceles are present. Varicocele:  None visualized. Pulsed Doppler interrogation of both testes demonstrates normal low resistance arterial and venous waveforms  bilaterally. IMPRESSION: 1. No evidence of testicular torsion. 2. Bilateral epididymal cysts. 3. Septated bilateral hydroceles. 4. Nonshadowing echogenic focus in the mid left testicle measuring 3 mm. This is nonspecific and may represent a small lipoma. Other etiologies are not excluded. This can be followed up in 3 months with ultrasound to confirm stability or definitively evaluated with MRI. Electronically Signed   By: Tyron Gallon M.D.   On: 09/24/2023 16:15    Procedures Procedures    Medications Ordered in ED Medications  morphine  (PF) 4 MG/ML  injection 4 mg (has no administration in time range)  ondansetron  (ZOFRAN ) injection 4 mg (has no administration in time range)  HYDROmorphone  (DILAUDID ) injection 2 mg (has no administration in time range)    ED Course/ Medical Decision Making/ A&P                                 Medical Decision Making Amount and/or Complexity of Data Reviewed Labs: ordered. Radiology: ordered.  Risk Prescription drug management.   Patient medicated here for pain.  Patient with severe pain to his left testicle.  Concern for possible torsion versus kidney stone.  Patient's testicle ultrasound was negative for torsion.  Shows improving right epididymitis.  Patient subsequently had a CT renal stone which was negative for kidney stones.  He was very hypertensive when he got here.  Patient received 1 dose of labetalol  20 mg.  Blood pressure self improved.  This was followed by an additional 10 mg of hydralazine.  Blood pressure is much better this time.  Patient will take his blood pressure medication tomorrow.  Has scheduled follow-up with urology.  Will prescribe new analgesics.  Return precautions given   CRITICAL CARE Performed by: Eden Goodpasture Total critical care time: 60 minutes Critical care time was exclusive of separately billable procedures and treating other patients. Critical care was necessary to treat or prevent imminent or  life-threatening deterioration. Critical care was time spent personally by me on the following activities: development of treatment plan with patient and/or surrogate as well as nursing, discussions with consultants, evaluation of patient's response to treatment, examination of patient, obtaining history from patient or surrogate, ordering and performing treatments and interventions, ordering and review of laboratory studies, ordering and review of radiographic studies, pulse oximetry and re-evaluation of patient's condition.         Final Clinical Impression(s) / ED Diagnoses Final diagnoses:  None    Rx / DC Orders ED Discharge Orders     None         Lind Repine, MD 09/25/23 2220

## 2023-09-25 NOTE — ED Provider Triage Note (Signed)
 Emergency Medicine Provider Triage Evaluation Note  Brandon Esparza , a 38 y.o. male  was evaluated in triage.  Pt complains of testicular pain.  Patient reports pain in left testicle.  He was seen yesterday for the same complaint states that were improved but then this afternoon pain worsened.  He is crying in triage.  Reports pain but no swelling.  Review of Systems  Positive: Left testicle pain Negative: Penile discharge, dysuria  Physical Exam  BP (!) 259/170 (BP Location: Left Arm)   Pulse 78   Temp 97.8 F (36.6 C) (Oral)   Resp 20   Ht 6\' 4"  (1.93 m)   Wt (!) 155 kg   SpO2 100%   BMI 41.59 kg/m  Gen:   Awake, crying Resp:  Normal effort  MSK:   Moves extremities without difficulty  Other:    Medical Decision Making  Medically screening exam initiated at 5:17 PM.  Appropriate orders placed.  Brandon Esparza was informed that the remainder of the evaluation will be completed by another provider, this initial triage assessment does not replace that evaluation, and the importance of remaining in the ED until their evaluation is complete.   Sonnie Dusky, PA-C 09/25/23 1718

## 2023-09-25 NOTE — ED Triage Notes (Addendum)
 Pt arrived reporting left side groin pain, denies swelling. States he was seen at [facility] yesterday and diagnosed with epididymitis. Denies any changes to urine. States he has no other symptoms, just the groin pain.  Pt extremely hypertensive in triage, states he is suppose to take BP meds but does not

## 2023-09-25 NOTE — Discharge Instructions (Addendum)
 Call the urologist tomorrow morning to schedule an appointment sooner

## 2023-10-01 NOTE — ED Notes (Signed)
 10/01/23 1845 pt called requesting note to return to work. Note provided as requested.

## 2023-10-07 DIAGNOSIS — Z9189 Other specified personal risk factors, not elsewhere classified: Secondary | ICD-10-CM | POA: Insufficient documentation

## 2023-10-07 DIAGNOSIS — D1772 Benign lipomatous neoplasm of other genitourinary organ: Secondary | ICD-10-CM | POA: Insufficient documentation

## 2023-10-07 DIAGNOSIS — N433 Hydrocele, unspecified: Secondary | ICD-10-CM | POA: Insufficient documentation

## 2023-10-07 DIAGNOSIS — Z8619 Personal history of other infectious and parasitic diseases: Secondary | ICD-10-CM | POA: Insufficient documentation

## 2023-10-07 DIAGNOSIS — N503 Cyst of epididymis: Secondary | ICD-10-CM | POA: Insufficient documentation

## 2023-10-07 DIAGNOSIS — Z87438 Personal history of other diseases of male genital organs: Secondary | ICD-10-CM | POA: Insufficient documentation

## 2023-10-07 NOTE — Progress Notes (Signed)
 Name: Brandon Esparza DOB: May 14, 1985 MRN: [id number]  History of Present Illness: Mr. Brandon Esparza is a 38 y.o. male who presents today as a new patient at [facility]. All available relevant medical records have been reviewed.   Per chart review: > 02/23/2023:  - Seen in ER for UTI symptoms.  - Urine microscopy: >50 WBC/hpf, 11-20 RBC/hpf, few bacteria.  - No urine culture.  - Treated with Keflex .   > 04/11/2023:  - Seen in ER for "right side groin/pelvic pain that seems to worsen with movement." - CT abdomen/pelvis w/ contrast showed no GU stones, masses, or hydronephrosis; bladder unremarkable. - Scrotal / testicular ultrasound: 1. No evidence of testicular torsion or mass. 2. Enlarged heterogeneous right epididymis without definite increased blood flow. This may reflect epididymitis. 3. Prominent left epididymal head cyst measuring 2 cm. - Unremarkable UA, CBC, CMP. - Tested positive for chlamydia. "He is sexually active with 2 females and does not use barrier protection." - Treated with Rocephin  IM and Levaquin  x10 days.  > 09/24/2023:  - STD testing negative for gonorrhea and chlamydia. - UA unremarkable. - Scrotal / testicular ultrasound showed: 1. No evidence of testicular torsion. 2. Bilateral epididymal cysts. 3. Septated bilateral hydroceles. 4. Nonshadowing echogenic focus in the mid left testicle measuring 3 mm. This is nonspecific and may represent a small lipoma. Other etiologies are not excluded. This can be followed up in 3 months with ultrasound to confirm stability or definitively evaluated with MRI.  > 09/25/2023:  - Unremarkable UA and BMP. - CT stone showed no GU stones, masses, or hydronephrosis; bladder unremarkable. - Repeat scrotal / testicular ultrasound showed: 1. No evidence of testicular torsion. 2. Right epididymitis.  Improving, complex small right hydrocele. 3. Stable 3 mm echogenic focus within the left testicle most in keeping  with a small intratesticular lipoma. As noted on prior examination, short-term follow-up imaging in 3-6 months would be helpful in excluding an alternative mass lesion.  Today: He reports that his scrotal pain resolved >1 week ago. Denies scrotal swelling, redness, or warmth today.   He reports chronic nocturia since childhood. Reports voiding 5-6x/night and has occasional urge incontinence overnight.  He reports history of obstructive sleep apnea; denies wearing CPAP routinely at this time (lost).  He denies fluid intake within 3 hours prior to bedtime and during the night.  He denies caffeine intake within 8 hours prior to bedtime. He denies routinely experiencing lower extremity edema during the day.   Medications: Current Outpatient Medications  Medication Sig Dispense Refill   oxybutynin (DITROPAN) 5 MG tablet Take 1 tablet (5 mg total) by mouth at bedtime. 30 tablet 2   amLODipine  (NORVASC ) 10 MG tablet Take 1 tablet (10 mg total) by mouth daily. 30 tablet 2   benzonatate  (TESSALON ) 100 MG capsule Take 2 capsules (200 mg total) by mouth 3 (three) times daily as needed. 30 capsule 0   cephALEXin  (KEFLEX ) 500 MG capsule Take 1 capsule (500 mg total) by mouth 3 (three) times daily. 21 capsule 0   HYDROcodone -acetaminophen  (NORCO/VICODIN) 5-325 MG tablet Take 1 tablet by mouth every 4 (four) hours as needed. 8 tablet 0   ibuprofen  (ADVIL ) 800 MG tablet Take 1 tablet (800 mg total) by mouth every 8 (eight) hours as needed for moderate pain. 12 tablet 0   lisinopril -hydrochlorothiazide  (ZESTORETIC ) 10-12.5 MG tablet Take 1 tablet by mouth daily. 30 tablet 2   metFORMIN  (GLUCOPHAGE ) 500 MG tablet Take 1 tablet (500 mg total) by  mouth 2 (two) times daily with a meal. 30 tablet 5   oseltamivir  (TAMIFLU ) 75 MG capsule Take 1 capsule (75 mg total) by mouth every 12 (twelve) hours. 10 capsule 0   oxyCODONE -acetaminophen  (PERCOCET) 7.5-325 MG tablet Take 1 tablet by mouth every 4 (four) hours as  needed for severe pain (pain score 7-10). 15 tablet 0   predniSONE  (DELTASONE ) 10 MG tablet Take 2 tablets (20 mg total) by mouth 2 (two) times daily with a meal. 20 tablet 0   predniSONE  (STERAPRED UNI-PAK 21 TAB) 10 MG (21) TBPK tablet Take by mouth daily. Take 6 tabs by mouth daily  for 2 days, then 5 tabs for 2 days, then 4 tabs for 2 days, then 3 tabs for 2 days, 2 tabs for 2 days, then 1 tab by mouth daily for 2 days 42 tablet 0   No current facility-administered medications for this visit.    Allergies: No Known Allergies  Past Medical History:  Diagnosis Date   Diabetes mellitus without complication (HCC)    Hypertension    Past Surgical History:  Procedure Laterality Date   ADENOIDECTOMY     APPENDECTOMY     TONSILLECTOMY     History reviewed. No pertinent family history. Social History   Socioeconomic History   Marital status: Single    Spouse name: Not on file   Number of children: Not on file   Years of education: Not on file   Highest education level: Not on file  Occupational History   Not on file  Tobacco Use   Smoking status: Every Day    Current packs/day: 0.50    Types: Cigarettes   Smokeless tobacco: Never  Substance and Sexual Activity   Alcohol use: No   Drug use: No   Sexual activity: Not on file  Other Topics Concern   Not on file  Social History Narrative   Not on file   Social Drivers of Health   Financial Resource Strain: Not on file  Food Insecurity: Not on file  Transportation Needs: Not on file  Physical Activity: Not on file  Stress: Not on file  Social Connections: Not on file  Intimate Partner Violence: Not on file    SUBJECTIVE  Review of Systems Constitutional: Patient denies any unintentional weight loss or change in strength lntegumentary: Patient denies any rashes or pruritus Cardiovascular: Patient denies chest pain or syncope Respiratory: Patient denies shortness of breath Gastrointestinal: Patient denies nausea,  vomiting, constipation  Musculoskeletal: Patient denies muscle cramps or weakness Neurologic: Patient denies convulsions or seizures Allergic/Immunologic: Patient denies recent allergic reaction(s) Hematologic/Lymphatic: Patient denies bleeding tendencies Endocrine: Patient denies heat/cold intolerance  GU: As per HPI.  OBJECTIVE Vitals:   10/08/23 1259  BP: (!) 143/81  Pulse: 88  Temp: 98.3 F (36.8 C)   There is no height or weight on file to calculate BMI.  Physical Examination Constitutional: No obvious distress; patient is non-toxic appearing  Cardiovascular: No visible lower extremity edema.  Respiratory: The patient does not have audible wheezing/stridor; respirations do not appear labored  Gastrointestinal: Abdomen non-distended Musculoskeletal: Normal ROM of UEs  Skin: No obvious rashes/open sores  Neurologic: CN 2-12 grossly intact Psychiatric: Answered questions appropriately with normal affect  Hematologic/Lymphatic/Immunologic: No obvious bruises or sites of spontaneous bleeding  UA: Patient did not void in office today - reported urinating prior to arrival PVR: 42 ml  ASSESSMENT Scrotal pain - Plan: BLADDER SCAN AMB NON-IMAGING, Urinalysis, Routine w reflex microscopic  Lipoma of  testis - Plan: US  SCROTUM W/DOPPLER  Bilateral hydrocele - Plan: US  SCROTUM W/DOPPLER  Epididymal cyst - Plan: US  SCROTUM W/DOPPLER  History of chlamydia  History of epididymitis - Plan: US  SCROTUM W/DOPPLER  History of sexual behavior with high risk of exposure to communicable disease  OSA (obstructive sleep apnea) - Plan: Ambulatory referral to Sleep Studies  Nocturia - Plan: oxybutynin (DITROPAN) 5 MG tablet, Ambulatory referral to Sleep Studies  Patient had no acute concerns re: scrotum at this time. We reviewed prior scrotal imaging results / findings. Advised repeat scrotal / testicular ultrasound in 6 months for surveillance.   For nocturia: We reviewed possible  etiologies for nocturia including but not limited to: caffeine intake, evening fluid intake, sleep apnea, peripheral edema, reduced bladder capacity, renal tubular dysfunction. We discussed how obstructive sleep apnea can contribute to nocturia and/or nocturnal polyuria. He agreed to be referred to re-establish care with Sleep Medicine specialty to address his OSA. We reviewed management options including: Behavioral changes: Minimizing caffeine intake (especially within 6-8 hours before bedtime). Minimizing fluid intake within 3 hours before bedtime. Minimizing overnight fluid intake. Medications: Oxybutynin IR 5 mg nightly.  We agreed to plan for follow up in 8 weeks for recheck or sooner if needed. Patient verbalized understanding of and agreement with current plan. All questions were answered.  PLAN Advised the following: Ditropan (Oxybutynin) 5 mg every night at bedtime. No caffeine intake 6 hours before bedtime. No fluid intake 3 hours before bedtime. Referred to Sleep Medicine specialty for OSA. Return in about 8 weeks (around 12/03/2023) for UA, PVR, & f/u with Griselda Lederer NP. Scrotal / testicular ultrasound in 6 months.  Orders Placed This Encounter  Procedures   US  SCROTUM W/DOPPLER    Standing Status:   Future    Expected Date:   04/08/2024    Expiration Date:   10/07/2024    Reason for Exam (SYMPTOM  OR DIAGNOSIS REQUIRED):   left intratesticular lipoma, bilateral epididymal cysts, septated bilateral hydroceles    Preferred imaging location?:   [facility]   Urinalysis, Routine w reflex microscopic   Ambulatory referral to Sleep Studies    Referral Priority:   Routine    Referral Type:   Consultation    Referral Reason:   Specialty Services Required    Number of Visits Requested:   1   BLADDER SCAN AMB NON-IMAGING    It has been explained that the patient is to follow regularly with their PCP in addition to all other providers involved in their care and to  follow instructions provided by these respective offices. Patient advised to contact urology clinic if any urologic-pertaining questions, concerns, new symptoms or problems arise in the interim period.  There are no Patient Instructions on file for this visit.  Electronically signed by:  Lauretta Ponto, MSN, FNP-C, CUNP 10/08/2023 1:50 PM

## 2023-10-08 ENCOUNTER — Ambulatory Visit (INDEPENDENT_AMBULATORY_CARE_PROVIDER_SITE_OTHER): Admitting: Urology

## 2023-10-08 ENCOUNTER — Encounter: Payer: Self-pay | Admitting: Urology

## 2023-10-08 VITALS — BP 143/81 | HR 88 | Temp 98.3°F

## 2023-10-08 DIAGNOSIS — D1772 Benign lipomatous neoplasm of other genitourinary organ: Secondary | ICD-10-CM | POA: Diagnosis not present

## 2023-10-08 DIAGNOSIS — G4733 Obstructive sleep apnea (adult) (pediatric): Secondary | ICD-10-CM

## 2023-10-08 DIAGNOSIS — N433 Hydrocele, unspecified: Secondary | ICD-10-CM

## 2023-10-08 DIAGNOSIS — Z8619 Personal history of other infectious and parasitic diseases: Secondary | ICD-10-CM

## 2023-10-08 DIAGNOSIS — Z9189 Other specified personal risk factors, not elsewhere classified: Secondary | ICD-10-CM

## 2023-10-08 DIAGNOSIS — R351 Nocturia: Secondary | ICD-10-CM | POA: Diagnosis not present

## 2023-10-08 DIAGNOSIS — N5082 Scrotal pain: Secondary | ICD-10-CM | POA: Diagnosis not present

## 2023-10-08 DIAGNOSIS — N503 Cyst of epididymis: Secondary | ICD-10-CM

## 2023-10-08 DIAGNOSIS — Z87438 Personal history of other diseases of male genital organs: Secondary | ICD-10-CM | POA: Diagnosis not present

## 2023-10-08 LAB — BLADDER SCAN AMB NON-IMAGING: Scan Result: 42

## 2023-10-08 MED ORDER — OXYBUTYNIN CHLORIDE 5 MG PO TABS: 5.0000 mg | ORAL_TABLET | Freq: Every evening | ORAL | 2 refills | Status: DC

## 2023-10-08 NOTE — Progress Notes (Signed)
 post void residual=42 ?

## 2023-10-14 ENCOUNTER — Ambulatory Visit: Admitting: Sleep Medicine

## 2023-11-19 ENCOUNTER — Ambulatory Visit (HOSPITAL_COMMUNITY)
Admission: RE | Admit: 2023-11-19 | Discharge: 2023-11-19 | Disposition: A | Discharge status: Home / Self Care | Source: Ambulatory Visit | Attending: Urology | Admitting: Urology

## 2023-11-19 DIAGNOSIS — D1772 Benign lipomatous neoplasm of other genitourinary organ: Secondary | ICD-10-CM | POA: Diagnosis not present

## 2023-11-19 DIAGNOSIS — N503 Cyst of epididymis: Secondary | ICD-10-CM | POA: Insufficient documentation

## 2023-11-19 DIAGNOSIS — N433 Hydrocele, unspecified: Secondary | ICD-10-CM | POA: Diagnosis not present

## 2023-11-19 DIAGNOSIS — Z87438 Personal history of other diseases of male genital organs: Secondary | ICD-10-CM | POA: Diagnosis not present

## 2023-11-19 DIAGNOSIS — N5089 Other specified disorders of the male genital organs: Secondary | ICD-10-CM | POA: Diagnosis not present

## 2023-11-20 ENCOUNTER — Ambulatory Visit: Admitting: Sleep Medicine

## 2023-11-20 ENCOUNTER — Ambulatory Visit: Payer: Self-pay | Admitting: Family Medicine

## 2023-11-25 ENCOUNTER — Ambulatory Visit: Payer: Self-pay | Admitting: Urology

## 2023-11-26 ENCOUNTER — Ambulatory Visit: Admitting: Sleep Medicine

## 2023-12-03 ENCOUNTER — Encounter: Payer: Self-pay | Admitting: Urology

## 2023-12-03 ENCOUNTER — Ambulatory Visit (INDEPENDENT_AMBULATORY_CARE_PROVIDER_SITE_OTHER): Admitting: Urology

## 2023-12-03 VITALS — BP 145/89 | HR 99

## 2023-12-03 DIAGNOSIS — N503 Cyst of epididymis: Secondary | ICD-10-CM | POA: Diagnosis not present

## 2023-12-03 DIAGNOSIS — N433 Hydrocele, unspecified: Secondary | ICD-10-CM | POA: Diagnosis not present

## 2023-12-03 DIAGNOSIS — Z87438 Personal history of other diseases of male genital organs: Secondary | ICD-10-CM

## 2023-12-03 DIAGNOSIS — R351 Nocturia: Secondary | ICD-10-CM | POA: Diagnosis not present

## 2023-12-03 DIAGNOSIS — G4733 Obstructive sleep apnea (adult) (pediatric): Secondary | ICD-10-CM | POA: Diagnosis not present

## 2023-12-03 LAB — BLADDER SCAN AMB NON-IMAGING: Scan Result: 24

## 2023-12-03 MED ORDER — SOLIFENACIN SUCCINATE 10 MG PO TABS: 10.0000 mg | ORAL_TABLET | Freq: Every day | ORAL | 11 refills | Status: AC

## 2023-12-03 NOTE — Patient Instructions (Signed)

## 2023-12-03 NOTE — Progress Notes (Signed)
 Bladder Scan completed today.  Patient can void prior to the bladder scan. Bladder scan result: 24  Performed By: [facility] LPN

## 2023-12-03 NOTE — Progress Notes (Signed)
 Name: Brandon Esparza DOB: 11/29/1985 MRN: [id number]  History of Present Illness: Brandon Esparza is a 38 y.o. male who presents today for follow up visit at [facility].    Recent history: > 09/25/2023:  - CT stone showed no GU stones, masses, or hydronephrosis; bladder unremarkable. - Scrotal / testicular ultrasound showed: 1. No evidence of testicular torsion. 2. Right epididymitis.  Improving, complex small right hydrocele. 3. 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 As noted on prior examination, short-term follow-up imaging in 3-6 months would be helpful in excluding an alternative mass lesion.   At 1st Urology visit on 10/08/2023: - Patient denied any acute scrotal pain or other symptoms. - Reported chronic nocturia since childhood: voiding 5-6x/night and has occasional urge incontinence overnight.  - He reports history of obstructive sleep apnea; denies wearing CPAP routinely at this time (lost).  - The plan was: Ditropan  (Oxybutynin ) 5 mg every night at bedtime. No caffeine intake 6 hours before bedtime. No fluid intake 3 hours before bedtime. Referred to Sleep Medicine specialty for OSA. Return in about 8 weeks (around 12/03/2023) for UA, PVR, & f/u with Lauraine Oz NP. Scrotal / testicular ultrasound in 6 months.  Since last visit: > 11/19/2023: Repeat scrotal / testicular ultrasound showed no acute findings. The previous area in question within the left testicle is no longer present. Has bilateral epididymal cysts and small right / trace left hydroceles.   Today: He denies significant improvement with nocturia with Ditropan  (Oxybutynin ) 5 mg nightly. He reports persistent / unchanged urinary frequency and nocturia x4. Denies urgency or urge incontinence. He was unable to proceed with the Sleep Medicine referral due to transportation challenges.   He denies scrotal pain, redness, warmth,  tenderness, swelling.    Medications: Current Outpatient Medications  Medication Sig Dispense Refill   solifenacin  (VESICARE ) 10 MG tablet Take 1 tablet (10 mg total) by mouth daily. 30 tablet 11   amLODipine  (NORVASC ) 10 MG tablet Take 1 tablet (10 mg total) by mouth daily. 30 tablet 2   benzonatate  (TESSALON ) 100 MG capsule Take 2 capsules (200 mg total) by mouth 3 (three) times daily as needed. 30 capsule 0   cephALEXin  (KEFLEX ) 500 MG capsule Take 1 capsule (500 mg total) by mouth 3 (three) times daily. 21 capsule 0   HYDROcodone -acetaminophen  (NORCO/VICODIN) 5-325 MG tablet Take 1 tablet by mouth every 4 (four) hours as needed. 8 tablet 0   ibuprofen  (ADVIL ) 800 MG tablet Take 1 tablet (800 mg total) by mouth every 8 (eight) hours as needed for moderate pain. 12 tablet 0   lisinopril -hydrochlorothiazide  (ZESTORETIC ) 10-12.5 MG tablet Take 1 tablet by mouth daily. 30 tablet 2   metFORMIN  (GLUCOPHAGE ) 500 MG tablet Take 1 tablet (500 mg total) by mouth 2 (two) times daily with a meal. 30 tablet 5   oseltamivir  (TAMIFLU ) 75 MG capsule Take 1 capsule (75 mg total) by mouth every 12 (twelve) hours. 10 capsule 0   oxyCODONE -acetaminophen  (PERCOCET) 7.5-325 MG tablet Take 1 tablet by mouth every 4 (four) hours as needed for severe pain (pain score 7-10). 15 tablet 0   predniSONE  (DELTASONE ) 10 MG tablet Take 2 tablets (20 mg total) by mouth 2 (two) times daily with a meal. 20 tablet 0   predniSONE  (STERAPRED UNI-PAK 21 TAB) 10 MG (21) TBPK tablet Take by mouth daily. Take 6 tabs by mouth daily  for 2 days, then 5 tabs for  2 days, then 4 tabs for 2 days, then 3 tabs for 2 days, 2 tabs for 2 days, then 1 tab by mouth daily for 2 days 42 tablet 0   No current facility-administered medications for this visit.    Allergies: No Known Allergies  Past Medical History:  Diagnosis Date   Diabetes mellitus without complication (HCC)    Hypertension    Past Surgical History:  Procedure Laterality  Date   ADENOIDECTOMY     APPENDECTOMY     TONSILLECTOMY     History reviewed. No pertinent family history. Social History   Socioeconomic History   Marital status: Single    Spouse name: Not on file   Number of children: Not on file   Years of education: Not on file   Highest education level: Not on file  Occupational History   Not on file  Tobacco Use   Smoking status: Every Day    Current packs/day: 0.50    Types: Cigarettes   Smokeless tobacco: Never  Substance and Sexual Activity   Alcohol use: No   Drug use: No   Sexual activity: Not on file  Other Topics Concern   Not on file  Social History Narrative   Not on file   Social Drivers of Health   Financial Resource Strain: Not on file  Food Insecurity: Not on file  Transportation Needs: Not on file  Physical Activity: Not on file  Stress: Not on file  Social Connections: Not on file  Intimate Partner Violence: Not on file    Review of Systems Constitutional: Patient denies any unintentional weight loss or change in strength lntegumentary: Patient denies any rashes or pruritus ENT: Denies dry mouth or dry eyes Cardiovascular: Patient denies chest pain or syncope Respiratory: Patient denies shortness of breath Gastrointestinal: Patient denies nausea, vomiting, constipation, or diarrhea  Musculoskeletal: Patient denies muscle cramps or weakness Neurologic: Patient denies convulsions or seizures Allergic/Immunologic: Patient denies recent allergic reaction(s) Hematologic/Lymphatic: Patient denies bleeding tendencies Endocrine: Patient denies heat/cold intolerance  GU: As per HPI.  OBJECTIVE Vitals:   12/03/23 1603  BP: (!) 145/89  Pulse: 99   There is no height or weight on file to calculate BMI.  Physical Examination Constitutional: No obvious distress; patient is non-toxic appearing  Cardiovascular: No visible lower extremity edema.  Respiratory: The patient does not have audible wheezing/stridor;  respirations do not appear labored  Gastrointestinal: Abdomen non-distended Musculoskeletal: Normal ROM of UEs  Skin: No obvious rashes/open sores  Neurologic: CN 2-12 grossly intact Psychiatric: Answered questions appropriately with normal affect  Hematologic/Lymphatic/Immunologic: No obvious bruises or sites of spontaneous bleeding  UA: negative  PVR: 24 ml  ASSESSMENT Nocturia - Plan: BLADDER SCAN AMB NON-IMAGING, Urinalysis, Routine w reflex microscopic, solifenacin  (VESICARE ) 10 MG tablet  History of epididymitis  Epididymal cyst  Bilateral hydrocele  OSA (obstructive sleep apnea)  We reviewed recent scrotal imaging results; no acute findings. Likely reactive hydrocele s/p epididymitis appears to be gradually resolving.  For nocturia: We reviewed possible etiologies for nocturia including but not limited to: caffeine intake, evening fluid intake, sleep apnea, peripheral edema, reduced bladder capacity, renal tubular dysfunction. Suspect that his untreated OSA remains contributory; he was advised to consult his PCP about that. We reviewed management options including: Behavioral changes: Minimizing caffeine intake (especially within 6-8 hours before bedtime). Minimizing fluid intake within 3 hours before bedtime. Minimizing overnight fluid intake. Medications: Failed Oxybutynin  IR 5 mg nightly. We agreed to trial Vesicare  (Solifenacin ) 10 mg nightly.  We agreed to plan for follow up in 6 weeks or sooner if needed. Patient verbalized understanding of and agreement with current plan. All questions were answered.  PLAN Advised the following: 1. Vesicare  (Solifenacin ) 10 mg daily. 2. No caffeine intake 6 hours before bedtime. 3. No fluid intake 3 hours before bedtime. 4. Patient to discuss untreated OSA with PCP. 5. Return in about 6 weeks (around 01/14/2024) for OAB, with UA & PVR.  Orders Placed This Encounter  Procedures   Urinalysis, Routine w reflex microscopic    BLADDER SCAN AMB NON-IMAGING    It has been explained that the patient is to follow regularly with their PCP in addition to all other providers involved in their care and to follow instructions provided by these respective offices. Patient advised to contact urology clinic if any urologic-pertaining questions, concerns, new symptoms or problems arise in the interim period.  Patient Instructions  Overactive bladder (OAB) overview for patients:  Symptoms may include: urinary urgency (gotta go feeling) urinary frequency (voiding >8 times per day) night time urination (nocturia) urge incontinence of urine (UUI)  While we do not know the exact etiology of OAB, several treatment options exist including:  Behavioral therapy: Reducing fluid intake Decreasing bladder stimulants (such as caffeine) and irritants (such as acidic food, spicy foods, alcohol) Urge suppression strategies Bladder retraining via timed voiding  Pelvic floor physical therapy  Medication(s) - can use one or both of the drug classes below. Anticholinergic / antimuscarinic medications:  Mechanism of action: Activate M3 receptors to reduce detrusor stimulation and increase bladder capacity   (parasympathetic nervous system). Effect: Relaxes the bladder to decrease overactivity, increase bladder storage capacity, and increase time between voids. Onset: Slow acting (may take 8-12 weeks to determine efficacy). Medications include: Vesicare  (Solifenacin ), Ditropan  (Oxybutynin ), Detrol (Tolterodine), Toviaz (Fesoterodine), Sanctura (Trospium), Urispas (Flavoxate), Enablex (Darifenacin), Bentyl (Dicyclomine), Levsin (Hyoscyamine ). Potential side effects include but are not limited to: Dry eyes, dry mouth, constipation, cognitive impairment, dementia risk with long term use, and urinary retention/ incomplete bladder emptying. Insurance companies generally prefer for patients to try 1-2 anticholinergic / antimuscarinic  medications first due to low cost. Some exceptions are made based on patient-specific comorbidities / risk factors. Beta-3 agonist medications: Mechanism of action: Stimulates selective B3 adrenergic receptors to cause smooth muscle bladder relaxation (sympathetic nervous system). Effect: Relaxes the bladder to decrease overactivity, increase bladder storage capacity, and increase time between voids. Onset: Slow acting (may take 8-12 weeks to determine efficacy). Medications include: Myrbetriq (Mirabegron) and Vibegron Andris). Potential side effects include but are not limited to: urinary retention / incomplete bladder emptying and elevated blood pressure (more likely to occur in individuals with pre-existing uncontrolled hypertension). These medications tend to be more expensive than the anticholinergic / antimuscarinic medications.   For patients with refractory OAB (if the above treatment options have been unsuccessful): Posterior tibial nerve stimulation (PTNS). Small acupuncture-type needle inserted near ankle with electric current to stimulate bladder via posterior tibial nerve pathway. Initially requires 12 weekly in-office treatments lasting 30 minutes each; followed by monthly in-office treatments lasting 30 minutes each for 1 year.  Bladder Botox injections. How it is done: Typically done via in-office cystoscopy; sometimes done in the OR depending on the situation. The bladder is numbed with lidocaine instilled via a catheter. Then the urologist injects Botox into the bladder muscle wall in about 20 locations. Causes local paralysis of the bladder muscle at the injection sites to reduce bladder muscle overactivity / spasms. The effect  lasts for approximately 6 months and cannot be reversed once performed. Risks may included but are not limited to: infection, incomplete bladder emptying/ urinary retention, short term need for self-catheterization or indwelling catheter, and need for  repeat therapy. There is a 5-12% chance of needing to catheterize with Botox - that usually resolves in a few months as the Botox wears off. Typically Botox injections would need to be repeated every 3-12 months since this is not a permanent therapy.  Sacral neuromodulation trial (Medtronic lnterStim or Axonics implant). Sacral neuromodulation is FDA-approved for uncontrolled urinary urgency, urinary frequency, urinary urge incontinence, non-obstructive urinary retention, or fecal incontinence. It is not FDA-approved as a treatment for pain. The goal of this therapy is at least a 50% improvement in symptoms. It is NOT realistic to expect a 100% cure. This is a a 2-step outpatient procedure. After a successful test period, a permanent wire and generator are placed in the OR. We discussed the risk of infection. We reviewed the fact that about 30% of patients fail the test phase and are not candidates for permanent generator placement. During the 1-2 week trial phase, symptoms are documented by the patient to determine response. If patient gets at least a 50% improvement in symptoms, they may then proceed with Step 2. Step 1: Trial lead placement. Per physician discretion, may done one of two ways: Percutaneous nerve evaluation (PNE) in the [facility] urology office. Performed by urologist under local anesthesia (numbing the area with lidocaine) using a spinal needle for placement of test wire, which usually stays in place for 5-7 days to determine therapy response. Test lead placement in OR under anesthesia. Usually stays in place 2 weeks to determine therapy response. > Step 2: Permanent implantation of sacral neuromodulation device, which is performed in the OR.  Sacral neuromodulation implants: All are conditionally MRI safe. Manufacturer: Medtronic Website: [employer] therapy/right-for-you.html Options: lnterStim X:  Non-rechargeable. The battery lasts 10 years on average. lnterStim Micro: Rechargeable. The battery lasts 15 years on average and must be charged routinely. Approximately 50% smaller implant than lnterStim X implant.  Manufacturer: Axonics Website: Findrealrelief.axonics.com Options: Non-rechargeable (Axonics F15): The battery lasts 15 years on average. Rechargeable (Axonics R20): The battery lasts 20 years on average and must be charged in office for about 1 hour every 6-10 months on average. Approximately 50% smaller implant than Axonics non-rechargeable implant.  Note: Generally the rechargeable devices are only advised for very small or thin patients who may not have sufficient adipose tissue to comfortably overlay the implanted device.  Suprapubic catheter (SP tube) placement. Only done in severely refractory OAB when all other options have failed or are not a viable treatment choice depending on patient factors. Involves placement of a catheter through the lower abdomen into the bladder to continuously drain the bladder into an external collection bag, which patient can then empty at their convenience every few hours. Done via an outpatient surgical procedure in the OR under anesthesia. Risks may included but are not limited to: surgical site pain, infections, skin irritation / breakdown, chronic bacteriuria, symptomatic UTls. The SP tube must stay in place continuously. This is a reversible procedure however - the insertion site will close if catheter is removed for more than a few hours. The SP tube must be exchanged routinely every 4 weeks to prevent the catheter from becoming clogged with sediment. SP tube exchanges are typically performed at a urology nurse visit or by a home health nurse.   Electronically signed by:  Lauraine  JAYSON Oz, FNP   12/03/23    4:42 PM

## 2023-12-04 LAB — URINALYSIS, ROUTINE W REFLEX MICROSCOPIC
Bilirubin, UA: NEGATIVE
Glucose, UA: NEGATIVE
Ketones, UA: NEGATIVE
Leukocytes,UA: NEGATIVE
Nitrite, UA: NEGATIVE
RBC, UA: NEGATIVE
Specific Gravity, UA: 1.025 (ref 1.005–1.030)
Urobilinogen, Ur: 1 mg/dL (ref 0.2–1.0)
pH, UA: 6 (ref 5.0–7.5)

## 2023-12-16 ENCOUNTER — Ambulatory Visit: Payer: Self-pay | Admitting: Nurse Practitioner

## 2023-12-17 NOTE — H&P (Signed)
  Patient: Keelen Quevedo   PID: [id number]  DOB: May 01, 1986  SEX: Male   Patient referred by  DDS for extraction teeth #9, 1, 16, 17, 32.  CC: No pain. Fractured tooth # 9 years ago.   Past Medical History:  Weed, Snoring, Sleep Apnea, Smoker, Morbid Obesity    Medications: Lisinopril     Allergies:     NKDA    Surgeries:   Adenoids, Appendectomy     Social History       Smoking:            Alcohol: Drug use:                             Exam: BMI 40. Enamel fracture # 9 with gray discoloration. Upper peg laterals, impacted 1, 16, 17, 32.   No purulence, edema, fluctuance, trismus. Oral cancer screening negative. Pharynx clear. No lymphadenopathy.  Panorex: Enamel fracture # 9. Upper peg laterals, impacted 1, 16, 17, 32.   Assessment: ASA 3. Non-restorable               Plan: Extraction Teeth #  1, 9, 16, 17, 32.  Hospital Day surgery.                 Rx: n               Risks and complications explained. Questions answered.   Glendia EMERSON Primrose, DMD

## 2023-12-21 ENCOUNTER — Other Ambulatory Visit: Payer: Self-pay

## 2023-12-21 ENCOUNTER — Encounter (HOSPITAL_COMMUNITY): Payer: Self-pay | Admitting: Oral Surgery

## 2023-12-21 NOTE — Progress Notes (Signed)
 SDW call  Patient was given pre-op instructions over the phone. Patient verbalized understanding of instructions provided.     PCP - Denies Cardiologist -  Pulmonary:    PPM/ICD - denies Device Orders - na Rep Notified - na   Chest x-ray - na EKG -  DOS, 12/24/2023 Stress Test - ECHO -  Cardiac Cath -   Sleep Study/sleep apnea/CPAP: denies  Type II diabetic.  States he stopped taking his Metformin  a while ago. Does not check his sugars. No history of A1C Fasting Blood sugar range:na How often check sugars: na   Blood Thinner Instructions: denies Aspirin Instructions:denies   ERAS Protcol - NPO   Anesthesia review: No   Patient denies shortness of breath, fever, cough and chest pain over the phone call  Your procedure is scheduled on Tuesday December 24, 2023  Report to [facility] Main Entrance A at  0930  A.M., then check in with the Admitting office.  Call this number if you have problems the morning of surgery:  [phone]   If you have any questions prior to your surgery date call [phone]: Open Monday-Friday 8am-4pm If you experience any cold or flu symptoms such as cough, fever, chills, shortness of breath, etc. between now and your scheduled surgery, please notify us  at the above number    Remember:  Do not eat or drink after midnight the night before your surgery  Take these medicines the morning of surgery with A SIP OF WATER:  Amlodipine , vesicare   As of today, STOP taking any Aspirin (unless otherwise instructed by your surgeon) Aleve, Naproxen, Ibuprofen , Motrin , Advil , Goody's, BC's, all herbal medications, fish oil, and all vitamins.

## 2023-12-24 ENCOUNTER — Other Ambulatory Visit: Payer: Self-pay

## 2023-12-24 ENCOUNTER — Encounter: Payer: Self-pay | Admitting: Nurse Practitioner

## 2023-12-24 ENCOUNTER — Ambulatory Visit (HOSPITAL_COMMUNITY)
Admission: RE | Admit: 2023-12-24 | Discharge: 2023-12-24 | Disposition: A | Discharge status: Home / Self Care | Attending: Oral Surgery | Admitting: Oral Surgery

## 2023-12-24 ENCOUNTER — Ambulatory Visit (HOSPITAL_COMMUNITY): Payer: Self-pay

## 2023-12-24 ENCOUNTER — Encounter (HOSPITAL_COMMUNITY): Payer: Self-pay | Admitting: Oral Surgery

## 2023-12-24 ENCOUNTER — Encounter (HOSPITAL_COMMUNITY)
Admission: RE | Disposition: A | Discharge status: Home / Self Care | Payer: Self-pay | Source: Home / Self Care | Attending: Oral Surgery

## 2023-12-24 DIAGNOSIS — K0850 Unsatisfactory restoration of tooth, unspecified: Secondary | ICD-10-CM | POA: Diagnosis not present

## 2023-12-24 DIAGNOSIS — G4730 Sleep apnea, unspecified: Secondary | ICD-10-CM | POA: Diagnosis not present

## 2023-12-24 DIAGNOSIS — F17200 Nicotine dependence, unspecified, uncomplicated: Secondary | ICD-10-CM | POA: Diagnosis not present

## 2023-12-24 DIAGNOSIS — E119 Type 2 diabetes mellitus without complications: Secondary | ICD-10-CM | POA: Insufficient documentation

## 2023-12-24 DIAGNOSIS — K029 Dental caries, unspecified: Secondary | ICD-10-CM | POA: Insufficient documentation

## 2023-12-24 DIAGNOSIS — I10 Essential (primary) hypertension: Secondary | ICD-10-CM | POA: Diagnosis not present

## 2023-12-24 DIAGNOSIS — Z6841 Body Mass Index (BMI) 40.0 and over, adult: Secondary | ICD-10-CM | POA: Diagnosis not present

## 2023-12-24 DIAGNOSIS — K011 Impacted teeth: Secondary | ICD-10-CM | POA: Insufficient documentation

## 2023-12-24 DIAGNOSIS — E66813 Obesity, class 3: Secondary | ICD-10-CM | POA: Diagnosis not present

## 2023-12-24 HISTORY — PX: TOOTH EXTRACTION: SHX859

## 2023-12-24 LAB — CBC
HCT: 40 % (ref 39.0–52.0)
Hemoglobin: 13 g/dL (ref 13.0–17.0)
MCH: 30.2 pg (ref 26.0–34.0)
MCHC: 32.5 g/dL (ref 30.0–36.0)
MCV: 93 fL (ref 80.0–100.0)
Platelets: 261 K/uL (ref 150–400)
RBC: 4.3 MIL/uL (ref 4.22–5.81)
RDW: 12.7 % (ref 11.5–15.5)
WBC: 6.8 K/uL (ref 4.0–10.5)
nRBC: 0 % (ref 0.0–0.2)

## 2023-12-24 LAB — BASIC METABOLIC PANEL WITH GFR
Anion gap: 9 (ref 5–15)
BUN: 12 mg/dL (ref 6–20)
CO2: 21 mmol/L — ABNORMAL LOW (ref 22–32)
Calcium: 8.6 mg/dL — ABNORMAL LOW (ref 8.9–10.3)
Chloride: 107 mmol/L (ref 98–111)
Creatinine, Ser: 0.88 mg/dL (ref 0.61–1.24)
GFR, Estimated: >60 mL/min (ref >60)
Glucose, Bld: 94 mg/dL (ref 70–99)
Potassium: 3.9 mmol/L (ref 3.5–5.1)
Sodium: 137 mmol/L (ref 135–145)

## 2023-12-24 LAB — GLUCOSE, CAPILLARY: Glucose-Capillary: 154 mg/dL — ABNORMAL HIGH (ref 70–99)

## 2023-12-24 SURGERY — DENTAL RESTORATION/EXTRACTIONS
Anesthesia: General | Site: Mouth

## 2023-12-24 MED ORDER — LACTATED RINGERS IV SOLN: INTRAVENOUS | Status: DC

## 2023-12-24 MED ORDER — OXYCODONE HCL 5 MG/5ML PO SOLN
ORAL | Status: AC
  Filled 2023-12-24: qty 5

## 2023-12-24 MED ORDER — ONDANSETRON HCL 4 MG/2ML IJ SOLN
4.0000 mg | Freq: Once | INTRAMUSCULAR | Status: DC | PRN
Start: 2023-12-24 — End: 2023-12-24

## 2023-12-24 MED ORDER — ONDANSETRON HCL 4 MG/2ML IJ SOLN
INTRAMUSCULAR | Status: DC | PRN
  Administered 2023-12-24: 4 mg via INTRAVENOUS

## 2023-12-24 MED ORDER — HYDRALAZINE HCL 20 MG/ML IJ SOLN
INTRAMUSCULAR | Status: AC
  Filled 2023-12-24: qty 1

## 2023-12-24 MED ORDER — AMOXICILLIN 500 MG PO CAPS: 500.0000 mg | ORAL_CAPSULE | Freq: Three times a day (TID) | ORAL | 0 refills | Status: AC

## 2023-12-24 MED ORDER — PROPOFOL 10 MG/ML IV BOLUS
INTRAVENOUS | Status: AC
  Filled 2023-12-24: qty 20

## 2023-12-24 MED ORDER — MIDAZOLAM HCL 2 MG/2ML IJ SOLN
INTRAMUSCULAR | Status: AC
  Filled 2023-12-24: qty 2

## 2023-12-24 MED ORDER — FENTANYL CITRATE (PF) 250 MCG/5ML IJ SOLN
INTRAMUSCULAR | Status: AC
  Filled 2023-12-24: qty 5

## 2023-12-24 MED ORDER — ACETAMINOPHEN 10 MG/ML IV SOLN: 1000.0000 mg | Freq: Once | INTRAVENOUS | Status: DC | PRN

## 2023-12-24 MED ORDER — ACETAMINOPHEN 10 MG/ML IV SOLN
INTRAVENOUS | Status: DC | PRN
  Administered 2023-12-24: 1000 mg via INTRAVENOUS

## 2023-12-24 MED ORDER — LABETALOL HCL 5 MG/ML IV SOLN
INTRAVENOUS | Status: AC
  Filled 2023-12-24: qty 4

## 2023-12-24 MED ORDER — ROCURONIUM BROMIDE 10 MG/ML (PF) SYRINGE
PREFILLED_SYRINGE | INTRAVENOUS | Status: DC | PRN
  Administered 2023-12-24: 80 mg via INTRAVENOUS

## 2023-12-24 MED ORDER — LIDOCAINE-EPINEPHRINE 2 %-1:100000 IJ SOLN
INTRAMUSCULAR | Status: AC
  Filled 2023-12-24: qty 1

## 2023-12-24 MED ORDER — DEXMEDETOMIDINE HCL IN NACL 80 MCG/20ML IV SOLN
INTRAVENOUS | Status: DC | PRN
  Administered 2023-12-24 (×2): 8 ug via INTRAVENOUS
  Administered 2023-12-24: 4 ug via INTRAVENOUS

## 2023-12-24 MED ORDER — FENTANYL CITRATE (PF) 100 MCG/2ML IJ SOLN
25.0000 ug | INTRAMUSCULAR | Status: DC | PRN
  Administered 2023-12-24 (×2): 50 ug via INTRAVENOUS

## 2023-12-24 MED ORDER — DEXAMETHASONE SODIUM PHOSPHATE 10 MG/ML IJ SOLN
INTRAMUSCULAR | Status: DC | PRN
  Administered 2023-12-24: 10 mg via INTRAVENOUS

## 2023-12-24 MED ORDER — FENTANYL CITRATE (PF) 100 MCG/2ML IJ SOLN
INTRAMUSCULAR | Status: AC
  Filled 2023-12-24: qty 2

## 2023-12-24 MED ORDER — OXYCODONE HCL 5 MG PO TABS: 5.0000 mg | ORAL_TABLET | Freq: Once | ORAL | Status: AC | PRN

## 2023-12-24 MED ORDER — LABETALOL HCL 5 MG/ML IV SOLN
10.0000 mg | Freq: Once | INTRAVENOUS | Status: AC
  Administered 2023-12-24: 10 mg via INTRAVENOUS

## 2023-12-24 MED ORDER — FENTANYL CITRATE (PF) 250 MCG/5ML IJ SOLN
INTRAMUSCULAR | Status: DC | PRN
  Administered 2023-12-24 (×2): 50 ug via INTRAVENOUS

## 2023-12-24 MED ORDER — CEFAZOLIN SODIUM-DEXTROSE 3-4 GM/150ML-% IV SOLN
3.0000 g | INTRAVENOUS | Status: AC
  Administered 2023-12-24: 3 g via INTRAVENOUS
  Filled 2023-12-24: qty 150

## 2023-12-24 MED ORDER — SUGAMMADEX SODIUM 200 MG/2ML IV SOLN
INTRAVENOUS | Status: DC | PRN
  Administered 2023-12-24: 200 mg via INTRAVENOUS

## 2023-12-24 MED ORDER — HYDRALAZINE HCL 20 MG/ML IJ SOLN
10.0000 mg | Freq: Once | INTRAMUSCULAR | Status: AC
  Administered 2023-12-24: 10 mg via INTRAVENOUS

## 2023-12-24 MED ORDER — OXYCODONE-ACETAMINOPHEN 5-325 MG PO TABS: 1.0000 | ORAL_TABLET | ORAL | 0 refills | Status: AC | PRN

## 2023-12-24 MED ORDER — ROCURONIUM BROMIDE 10 MG/ML (PF) SYRINGE
PREFILLED_SYRINGE | INTRAVENOUS | Status: AC
  Filled 2023-12-24: qty 10

## 2023-12-24 MED ORDER — LIDOCAINE-EPINEPHRINE 2 %-1:100000 IJ SOLN
INTRAMUSCULAR | Status: DC | PRN
  Administered 2023-12-24: 19 mL

## 2023-12-24 MED ORDER — OXYCODONE HCL 5 MG/5ML PO SOLN
5.0000 mg | Freq: Once | ORAL | Status: AC | PRN
  Administered 2023-12-24: 5 mg via ORAL

## 2023-12-24 MED ORDER — LIDOCAINE 2% (20 MG/ML) 5 ML SYRINGE
INTRAMUSCULAR | Status: DC | PRN
  Administered 2023-12-24: 100 mg via INTRAVENOUS

## 2023-12-24 MED ORDER — LIDOCAINE 2% (20 MG/ML) 5 ML SYRINGE
INTRAMUSCULAR | Status: AC
  Filled 2023-12-24: qty 5

## 2023-12-24 MED ORDER — DEXAMETHASONE SODIUM PHOSPHATE 10 MG/ML IJ SOLN
INTRAMUSCULAR | Status: AC
  Filled 2023-12-24: qty 1

## 2023-12-24 MED ORDER — PROPOFOL 10 MG/ML IV BOLUS
INTRAVENOUS | Status: DC | PRN
  Administered 2023-12-24: 200 mg via INTRAVENOUS

## 2023-12-24 MED ORDER — CHLORHEXIDINE GLUCONATE 0.12 % MT SOLN: 15.0000 mL | Freq: Two times a day (BID) | OROMUCOSAL | 0 refills | Status: AC

## 2023-12-24 MED ORDER — CHLORHEXIDINE GLUCONATE 0.12 % MT SOLN
15.0000 mL | OROMUCOSAL | Status: AC
  Administered 2023-12-24: 15 mL via OROMUCOSAL
  Filled 2023-12-24 (×2): qty 15

## 2023-12-24 MED ORDER — MIDAZOLAM HCL 2 MG/2ML IJ SOLN
INTRAMUSCULAR | Status: DC | PRN
  Administered 2023-12-24: 2 mg via INTRAVENOUS

## 2023-12-24 MED ORDER — ONDANSETRON HCL 4 MG/2ML IJ SOLN
INTRAMUSCULAR | Status: AC
Start: 2023-12-24 — End: 2023-12-24
  Filled 2023-12-24: qty 2

## 2023-12-24 SURGICAL SUPPLY — 26 items
BAG COUNTER SPONGE SURGICOUNT (BAG) IMPLANT
BLADE SURG 15 STRL LF DISP TIS (BLADE) ×1 IMPLANT
BUR CROSS CUT FISSURE 1.6 (BURR) ×1 IMPLANT
BUR EGG ELITE 4.0 (BURR) IMPLANT
CANISTER SUCTION 3000ML PPV (SUCTIONS) ×1 IMPLANT
COVER SURGICAL LIGHT HANDLE (MISCELLANEOUS) ×1 IMPLANT
GAUZE PACKING FOLDED 2 STR (GAUZE/BANDAGES/DRESSINGS) ×1 IMPLANT
GLOVE BIO SURGEON STRL SZ8 (GLOVE) ×1 IMPLANT
GOWN STRL REUS W/ TWL LRG LVL3 (GOWN DISPOSABLE) ×1 IMPLANT
GOWN STRL REUS W/ TWL XL LVL3 (GOWN DISPOSABLE) ×1 IMPLANT
IV NS 1000ML BAXH (IV SOLUTION) ×1 IMPLANT
KIT BASIN OR (CUSTOM PROCEDURE TRAY) ×1 IMPLANT
KIT TURNOVER KIT B (KITS) ×1 IMPLANT
NDL HYPO 25GX1X1/2 BEV (NEEDLE) ×2 IMPLANT
NEEDLE HYPO 25GX1X1/2 BEV (NEEDLE) ×2 IMPLANT
NS IRRIG 1000ML POUR BTL (IV SOLUTION) ×1 IMPLANT
PAD ARMBOARD POSITIONER FOAM (MISCELLANEOUS) ×1 IMPLANT
SLEEVE IRRIGATION ELITE 7 (MISCELLANEOUS) ×1 IMPLANT
SPIKE FLUID TRANSFER (MISCELLANEOUS) IMPLANT
SUT CHROMIC 3 0 SH 27 (SUTURE) IMPLANT
SUT PLAIN 3 0 PS2 27 (SUTURE) IMPLANT
SYR BULB IRRIG 60ML STRL (SYRINGE) ×1 IMPLANT
SYR CONTROL 10ML LL (SYRINGE) ×1 IMPLANT
TRAY ENT MC OR (CUSTOM PROCEDURE TRAY) ×1 IMPLANT
TUBING IRRIGATION (MISCELLANEOUS) ×1 IMPLANT
YANKAUER SUCT BULB TIP NO VENT (SUCTIONS) ×1 IMPLANT

## 2023-12-24 NOTE — H&P (Signed)
 H&P documentation  -History and Physical Reviewed  -Patient has been re-examined  -No change in the plan of care  Brandon Esparza

## 2023-12-24 NOTE — Op Note (Unsigned)
 NAMEGAYLAN, FAUVER MEDICAL RECORD NO: [id number] ACCOUNT NO: [id number] DATE OF BIRTH: [birth year] FACILITY: [facility] LOCATION: [facility] PHYSICIAN: Glendia EMERSON Primrose, DDS  Operative Report   DATE OF PROCEDURE: 12/24/2023  PREOPERATIVE DIAGNOSES:  Nonrestorable teeth numbers 1, 9, and 16 secondary to dental caries.  Impacted teeth numbers 17 and 32.  Morbid obesity.  POSTOPERATIVE DIAGNOSES: Nonrestorable teeth numbers 1, 9, and 16 secondary to dental caries.  Impacted teeth numbers 17 and 32.  Morbid obesity.  PROCEDURE:  Extraction of teeth numbers 1, 9, 16, 17, and 32.  SURGEON:  Glendia EMERSON Primrose, DDS  ANESTHESIA:  General.  Nasal intubation.  ANESTHESIA ATTENDING:  Dr. Keneth.  DESCRIPTION OF PROCEDURE:  The patient was taken to the operating room and placed on the table in supine position.  General anesthesia was administered.  The nasal endotracheal tube was placed and secured.  The eyes were protected and the patient was  draped for surgery.  A timeout was performed.  The posterior pharynx was suctioned and a throat pack was placed.  2% lidocaine  with 1:100,000 epinephrine  was infiltrated in an inferior alveolar block on the right and left sides and a buccal and palatal  infiltration in the maxilla around teeth numbers 1, 16, and 9.  A 15 blade was used to make an incision around tooth #9.  The tooth was elevated with a 301 elevator but crumbled upon attempted removal with the forceps.  The flap was then reflected and  bone was removed using a Stryker handpiece and fissure bur under irrigation to remove bone around the root of tooth #9.  Then, the root was elevated and removed with the rongeur.  The socket was debrided and then irrigated and closed with 3-0 chromic.   Then, attention was turned to the left mandible.  A 15 blade was used to make an incision on the distal buccal aspect of tooth #17 and carried forward in the buccal gingival sulcus to tooth #18.  The periosteum was infected.   Bone was removed using a  Stryker handpiece to make a buccal trough around the tooth.  The tooth was then elevated and removed from the mouth with the lower forceps.  The socket was curetted, irrigated, and closed with 3-0 chromic.  Then, a 15 blade was used to make an incision  in the gingival sulcus around tooth #16.  The periosteum was reflected.  The tooth was elevated with a 301 elevator and removed from the mouth with dental forceps.  This socket was curetted, irrigated, and closed with 3-0 chromic.  Attention was then  turned to the right side.  Tooth #32 was removed using a #15 blade to make an incision distal buccally and carried forward in the gingival sulcus to tooth #31.  The periosteum was reflected.  Bone was removed buccally with a Stryker handpiece and fissure  bur.  The tooth was luxated, but could not be removed.  Then, the tooth was sectioned into multiple pieces with the Stryker handpiece and removed with a 301 elevator and rongeurs.  The socket was curetted, irrigated, and closed with 3-0 chromic.  Then,  a 15 blade was used to make an incision around tooth #16.  The periosteum was reflected.  The tooth was elevated with a dental elevator and removed from the mouth with upper universal forceps.  The socket was curetted, irrigated, and closed with 3-0  chromic.  Then, the oral cavity was irrigated and suctioned.  The throat pack was removed.  The  patient was left in care of anesthesia for extubation and transport to recovery with plans for discharge home through Day Surgery.  ESTIMATED BLOOD LOSS:  Minimal.  COMPLICATIONS:  None.  SPECIMENS:  None.   PUS D: 12/24/2023 11:52:23 am T: 12/24/2023 3:57:00 pm  JOB: [id number]/ [id number]

## 2023-12-24 NOTE — Transfer of Care (Signed)
 Immediate Anesthesia Transfer of Care Note  Patient: Brandon Esparza  Procedure(s) Performed: DENTAL RESTORATION/EXTRACTIONS (Mouth)   Patient Location: PACU  Anesthesia Type:General  Level of Consciousness: sedated  Airway & Oxygen  Therapy: Patient Spontanous Breathing and Patient connected to face mask oxygen   Post-op Assessment: Report given to RN and Post -op Vital signs reviewed and stable  Post vital signs: Reviewed and stable  Last Vitals:  Vitals Value Taken Time  BP 189/114 12/24/23 12:04  Temp    Pulse 70 12/24/23 12:06  Resp 20 12/24/23 12:06  SpO2 94 % 12/24/23 12:06  Vitals shown include unfiled device data.  Last Pain:  Vitals:   12/24/23 0934  TempSrc:   PainSc: 0-No pain      Patients Stated Pain Goal: 0 (12/24/23 0934)  Complications: No notable events documented.

## 2023-12-24 NOTE — Anesthesia Preprocedure Evaluation (Signed)
 Anesthesia Evaluation  Patient identified by MRN, date of birth, ID band Patient awake    Reviewed: Allergy & Precautions, NPO status , Patient's Chart, lab work & pertinent test results, reviewed documented beta blocker date and time   History of Anesthesia Complications Negative for: history of anesthetic complications  Airway Mallampati: II  TM Distance: >3 FB     Dental  (+) Poor Dentition, Missing, Loose   Pulmonary sleep apnea , neg COPD, Current Smoker and Patient abstained from smoking.   breath sounds clear to auscultation       Cardiovascular hypertension, (-) CAD, (-) Past MI, (-) Cardiac Stents and (-) CABG  Rhythm:Regular Rate:Normal     Neuro/Psych neg Seizures    GI/Hepatic ,neg GERD  ,,(+) neg Cirrhosis        Endo/Other  diabetes  Class 3 obesity  Renal/GU Renal disease     Musculoskeletal   Abdominal   Peds  Hematology   Anesthesia Other Findings   Reproductive/Obstetrics                              Anesthesia Physical Anesthesia Plan  ASA: 2  Anesthesia Plan: General   Post-op Pain Management:    Induction: Intravenous  PONV Risk Score and Plan: 2 and Ondansetron  and Dexamethasone   Airway Management Planned: Nasal ETT and Video Laryngoscope Planned  Additional Equipment:   Intra-op Plan:   Post-operative Plan: Extubation in OR  Informed Consent: I have reviewed the patients History and Physical, chart, labs and discussed the procedure including the risks, benefits and alternatives for the proposed anesthesia with the patient or authorized representative who has indicated his/her understanding and acceptance.     Dental advisory given  Plan Discussed with: CRNA  Anesthesia Plan Comments:         Anesthesia Quick Evaluation

## 2023-12-24 NOTE — Op Note (Signed)
 12/24/2023  11:47 AM  PATIENT:  Brandon Esparza  38 y.o. male  PRE-OPERATIVE DIAGNOSIS:  NON-RESTORABLE TEETH # 1, 9, 16 SECONDARY TO DENTAL CARIES, IMPACTED TEETH # 17, 32.   POST-OPERATIVE DIAGNOSIS:  SAME  PROCEDURE:  Procedure(s): EXTRACTION TEETH # 1, 9, 16, 17, 32  SURGEON:  Surgeon(s): Sheryle Hamilton, DMD  ANESTHESIA:   local and general  EBL:  minimal  DRAINS: none   SPECIMEN:  No Specimen  COUNTS:  YES  PLAN OF CARE: Discharge to home after PACU  PATIENT DISPOSITION:  PACU - hemodynamically stable.   PROCEDURE DETAILS: Dictation # [id number]  Hamilton EMERSON Sheryle, DMD 12/24/2023 11:47 AM

## 2023-12-25 ENCOUNTER — Encounter (HOSPITAL_COMMUNITY): Payer: Self-pay | Admitting: Oral Surgery

## 2023-12-25 NOTE — Anesthesia Postprocedure Evaluation (Signed)
 Anesthesia Post Note  Patient: Brandon Esparza  Procedure(s) Performed: DENTAL RESTORATION/EXTRACTIONS (Mouth)     Patient location during evaluation: PACU Anesthesia Type: General Level of consciousness: awake and alert Pain management: pain level controlled Vital Signs Assessment: post-procedure vital signs reviewed and stable Respiratory status: spontaneous breathing, nonlabored ventilation, respiratory function stable and patient connected to nasal cannula oxygen  Cardiovascular status: blood pressure returned to baseline and stable Postop Assessment: no apparent nausea or vomiting Anesthetic complications: no   No notable events documented.  Last Vitals:  Vitals:   12/24/23 1415 12/24/23 1430  BP: (!) 183/112 (!) 163/106  Pulse: 65 61  Resp: 14 12  Temp:  36.4 C  SpO2: 100% 99%    Last Pain:  Vitals:   12/24/23 1430  TempSrc:   PainSc: 0-No pain                 Lynwood MARLA Cornea

## 2024-01-16 ENCOUNTER — Ambulatory Visit: Admitting: Urology

## 2024-01-20 ENCOUNTER — Ambulatory Visit
Admission: EM | Admit: 2024-01-20 | Discharge: 2024-01-20 | Disposition: A | Discharge status: Home / Self Care | Attending: Family Medicine | Admitting: Family Medicine

## 2024-01-20 ENCOUNTER — Other Ambulatory Visit: Payer: Self-pay

## 2024-01-20 DIAGNOSIS — R030 Elevated blood-pressure reading, without diagnosis of hypertension: Secondary | ICD-10-CM | POA: Diagnosis not present

## 2024-01-20 DIAGNOSIS — J208 Acute bronchitis due to other specified organisms: Secondary | ICD-10-CM | POA: Diagnosis not present

## 2024-01-20 LAB — POC SOFIA SARS ANTIGEN FIA: SARS Coronavirus 2 Ag: NEGATIVE

## 2024-01-20 MED ORDER — BENZONATATE 200 MG PO CAPS: 200.0000 mg | ORAL_CAPSULE | Freq: Three times a day (TID) | ORAL | 0 refills | Status: AC | PRN

## 2024-01-20 MED ORDER — AZELASTINE HCL 0.1 % NA SOLN: 1.0000 | Freq: Two times a day (BID) | NASAL | 0 refills | Status: AC

## 2024-01-20 MED ORDER — LISINOPRIL-HYDROCHLOROTHIAZIDE 20-12.5 MG PO TABS: 1.0000 | ORAL_TABLET | Freq: Every day | ORAL | 0 refills | Status: AC

## 2024-01-20 MED ORDER — ALBUTEROL SULFATE HFA 108 (90 BASE) MCG/ACT IN AERS: 2.0000 | INHALATION_SPRAY | RESPIRATORY_TRACT | 0 refills | Status: AC | PRN

## 2024-01-20 NOTE — ED Provider Notes (Signed)
 [facility]    CSN: [id number] Arrival date & time: 01/20/24  9147      History   Chief Complaint Chief Complaint  Patient presents with   Nasal Congestion   Cough   Headache    HPI Brandon Esparza is a 38 y.o. male.   Patient presenting today with 2-day history of cough, headache, congestion, body aches, fatigue.  Denies fever, chills, chest pain, shortness of breath, abdominal pain, vomiting, diarrhea.  States son has been sick with similar symptoms.  Tried Tylenol  and ibuprofen  and Alka-Seltzer with minimal relief.  Also complaining of ongoing elevated blood pressure readings.  Does not check his home blood pressures but states he has been to multiple hospital visits and now this 1 where his blood pressures have been significant elevated despite consistent use of his amlodipine  and lisinopril  hydrochlorothiazide .  He has been try to get in with his primary care to see about changing his dosage but states they cannot see him until January.  Denies chest pain, palpitations, dizziness, headaches apart from current viral symptoms.    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 Authorizing Provider  albuterol  (VENTOLIN  HFA) 108 (90 Base) MCG/ACT inhaler Inhale 2 puffs into the lungs every 4 (four) hours as needed. 01/20/24  Yes Stuart Vernell Norris, PA-C  azelastine  (ASTELIN ) 0.1 % nasal spray Place 1 spray into both nostrils 2 (two) times daily. Use in each nostril as directed 01/20/24  Yes Stuart Vernell Norris, PA-C  benzonatate  (TESSALON ) 200 MG capsule Take 1 capsule (200 mg total) by mouth 3 (three) times daily as needed for cough. 01/20/24  Yes Stuart Vernell Norris, PA-C  lisinopril -hydrochlorothiazide  (ZESTORETIC ) 20-12.5 MG tablet Take 1 tablet by mouth daily. 01/20/24  Yes Stuart Vernell Norris, PA-C  acetaminophen  (TYLENOL ) 500 MG tablet Take 1,000 mg by mouth every 6 (six) hours as needed for mild pain (pain score 1-3) or moderate pain (pain score 4-6). Patient not taking: Reported on 01/20/2024    [provider]  amLODipine  (NORVASC ) 10 MG tablet Take 1 tablet (10 mg total) by mouth daily. 09/24/23   Odell Balls, PA-C  amoxicillin  (AMOXIL ) 500 MG capsule Take 1 capsule (500 mg total) by mouth 3 (three) times daily. Patient not taking: Reported on 01/20/2024 12/24/23   Sheryle Hamilton, DMD  chlorhexidine  (PERIDEX ) 0.12 % solution Use as directed 15 mLs in the mouth or throat 2 (two) times daily. Patient not taking: Reported on 01/20/2024 12/24/23   Sheryle Hamilton, DMD  oxybutynin  (DITROPAN ) 5 MG tablet Take 5 mg by mouth daily. Patient not taking: Reported on 01/20/2024  [provider]  oxyCODONE -acetaminophen  (PERCOCET) 5-325 MG tablet Take 1 tablet by mouth every 4 (four) hours as needed. Patient not taking: Reported on 01/20/2024 12/24/23   Sheryle Hamilton, DMD  solifenacin  (VESICARE ) 10 MG tablet Take 1 tablet (10 mg total) by mouth daily. Patient not taking: Reported on 12/23/2023 12/03/23   Gerldine Lauraine BROCKS, FNP    Family History History reviewed. No pertinent family history.  Social History Social History    Tobacco Use   Smoking status: Every Day    Current packs/day: 0.50    Types: Cigarettes   Smokeless tobacco: Never  Vaping Use   Vaping status: Never Used  Substance Use Topics   Alcohol use: No   Drug use: No     Allergies   Patient has no known allergies.   Review of Systems Review of Systems Per HPI  Physical Exam Triage Vital Signs ED Triage Vitals  Encounter Vitals Group     BP 01/20/24 0934 (!) 157/111     Girls Systolic BP Percentile --      Girls Diastolic BP Percentile --      Boys Systolic BP Percentile --      Boys Diastolic BP Percentile --      Pulse Rate 01/20/24 0934 80     Resp 01/20/24 0934 20     Temp 01/20/24 0934 98.4 F (36.9 C)     Temp Source 01/20/24 0934 Oral     SpO2 01/20/24 0934 95 %     Weight --      Height --      Head Circumference --      Peak Flow --      Pain Score 01/20/24 0931 6     Pain Loc --      Pain Education --      Exclude from Growth Chart --    No data found.  Updated Vital Signs BP (!) 157/111 (BP Location: Left Arm)   Pulse 80   Temp 98.4 F (36.9 C) (Oral)   Resp 20   SpO2 95%   Visual Acuity Right Eye Distance:   Left Eye Distance:   Bilateral Distance:    Right Eye Near:   Left Eye Near:    Bilateral Near:     Physical Exam Vitals and nursing note reviewed.  Constitutional:      Appearance: He is well-developed.  HENT:     Head: Atraumatic.     Right Ear: Tympanic membrane and external ear normal.     Left Ear: Tympanic membrane and external ear normal.     Nose: Rhinorrhea present.     Mouth/Throat:     Mouth: Mucous membranes are moist.     Pharynx: Posterior oropharyngeal erythema present. No oropharyngeal exudate.  Eyes:     Conjunctiva/sclera: Conjunctivae normal.     Pupils: Pupils are equal, round, and reactive to light.  Cardiovascular:     Rate and Rhythm: Normal rate and regular rhythm.     Heart sounds: Normal heart sounds.  Pulmonary:     Effort: Pulmonary effort is  normal. No respiratory distress.     Breath sounds: Wheezing present. No rales.  Musculoskeletal:        General: Normal range of motion.     Cervical back: Normal range of motion and neck supple.  Lymphadenopathy:     Cervical: No cervical adenopathy.  Skin:    General: Skin is warm and dry.  Neurological:  Mental Status: He is alert and oriented to person, place, and time.  Psychiatric:        Behavior: Behavior normal.      UC Treatments / Results  Labs (all labs ordered are listed, but only abnormal results are displayed) Labs Reviewed  POC SOFIA SARS ANTIGEN FIA    EKG   Radiology No results found.  Procedures Procedures (including critical care time)  Medications Ordered in UC Medications - No data to display  Initial Impression / Assessment and Plan / UC Course  I have reviewed the triage vital signs and the nursing notes.  Pertinent labs & imaging results that were available during my care of the patient were reviewed by me and considered in my medical decision making (see chart for details).     Hypertensive in triage, otherwise vital signs within normal limits.  Regarding his blood pressure regimen, he states he has been on the same medications for several years and it has been elevated every time he has been to a medical appointment and he thinks his dosage needs to be increased.  Will slightly increase his lisinopril  hydrochlorothiazide  dose and otherwise leave regimen consistent.  Discussed lifestyle changes, and importance of checking home blood pressures and logging readings for upcoming primary care visit.  Discussed to call primary care today and see about moving the appointment up for a blood pressure recheck.  Follow-up here sooner for side effects, worsening symptoms.  Regarding his viral symptoms, rapid COVID-negative.  Suspect viral bronchitis.  Will treat with albuterol , Astelin , Tessalon , supportive over-the-counter medications at home care.   Return for worsening symptoms.  Final Clinical Impressions(s) / UC Diagnoses   Final diagnoses:  Elevated blood pressure reading  Viral bronchitis     Discharge Instructions      I have increased the dosage of your lisinopril  hydrochlorothiazide .  Continue amlodipine  at current dose as this is the maximum dose.  It is imperative that you get a home blood pressure machine so that you can be writing down your home readings once to twice daily and follow-up as soon as possible with your primary care provider.  Follow-up sooner for side effects or low readings.  Your COVID test was negative today, I suspect a different viral infection causing your symptoms.  I have prescribed an albuterol  inhaler as you have some wheezes on exam today and some cough medicine, nasal spray.  In addition, you may take Coricidin HBP, plain Mucinex, Flonase nasal spray, saline sinus rinses, Tylenol  as needed.  Return for worsening symptoms.    ED Prescriptions     Medication Sig Dispense Auth. Provider   lisinopril -hydrochlorothiazide  (ZESTORETIC ) 20-12.5 MG tablet Take 1 tablet by mouth daily. 30 tablet Stuart Vernell Norris, PA-C   azelastine  (ASTELIN ) 0.1 % nasal spray Place 1 spray into both nostrils 2 (two) times daily. Use in each nostril as directed 30 mL Stuart Vernell Norris, PA-C   albuterol  (VENTOLIN  HFA) 108 (90 Base) MCG/ACT inhaler Inhale 2 puffs into the lungs every 4 (four) hours as needed. 18 g Stuart Vernell Norris, PA-C   benzonatate  (TESSALON ) 200 MG capsule Take 1 capsule (200 mg total) by mouth 3 (three) times daily as needed for cough. 20 capsule Stuart Vernell Norris, [place]      PDMP not reviewed this encounter.   Stuart Vernell Norris, [place] 01/20/24 1023

## 2024-01-20 NOTE — ED Triage Notes (Addendum)
 Pt reports being seen in UC for cough, headache, congestion, body aches, nasal drainage x2 days. Pt denies taking tylenol  and motrin . Pt unsure of fever. Pt denies SOB, n/v. Pt repots son has been sick at home. Pt reports taking BP medication today.

## 2024-01-20 NOTE — Discharge Instructions (Signed)
 I have increased the dosage of your lisinopril  hydrochlorothiazide .  Continue amlodipine  at current dose as this is the maximum dose.  It is imperative that you get a home blood pressure machine so that you can be writing down your home readings once to twice daily and follow-up as soon as possible with your primary care provider.  Follow-up sooner for side effects or low readings.  Your COVID test was negative today, I suspect a different viral infection causing your symptoms.  I have prescribed an albuterol  inhaler as you have some wheezes on exam today and some cough medicine, nasal spray.  In addition, you may take Coricidin HBP, plain Mucinex, Flonase nasal spray, saline sinus rinses, Tylenol  as needed.  Return for worsening symptoms.

## 2024-04-28 ENCOUNTER — Ambulatory Visit

## 2024-04-29 DIAGNOSIS — I10 Essential (primary) hypertension: Secondary | ICD-10-CM | POA: Diagnosis not present

## 2024-04-29 DIAGNOSIS — Z113 Encounter for screening for infections with a predominantly sexual mode of transmission: Secondary | ICD-10-CM | POA: Diagnosis not present

## 2024-04-29 DIAGNOSIS — Z6841 Body Mass Index (BMI) 40.0 and over, adult: Secondary | ICD-10-CM | POA: Diagnosis not present

## 2024-05-04 NOTE — Progress Notes (Unsigned)
" ° ° ° °  History of Present Illness: ***  Past Medical History:  Diagnosis Date   Diabetes mellitus without complication (HCC)    Hypertension     Past Surgical History:  Procedure Laterality Date   ADENOIDECTOMY     APPENDECTOMY     TONSILLECTOMY     TOOTH EXTRACTION N/A 12/24/2023   Procedure: DENTAL RESTORATION/EXTRACTIONS;  Surgeon: Sheryle Hamilton, DMD;  Location: [facility] OR;  Service: Oral Surgery;  Laterality: N/A;    Home Medications:  Allergies as of 05/05/2024   No Known Allergies      Medication List        Accurate as of May 04, 2024  7:38 PM. If you have any questions, ask your nurse or doctor.          acetaminophen  500 MG tablet Commonly known as: TYLENOL  Take 1,000 mg by mouth every 6 (six) hours as needed for mild pain (pain score 1-3) or moderate pain (pain score 4-6).   albuterol  108 (90 Base) MCG/ACT inhaler Commonly known as: VENTOLIN  HFA Inhale 2 puffs into the lungs every 4 (four) hours as needed.   amLODipine  10 MG tablet Commonly known as: NORVASC  Take 1 tablet (10 mg total) by mouth daily.   amoxicillin  500 MG capsule Commonly known as: AMOXIL  Take 1 capsule (500 mg total) by mouth 3 (three) times daily.   azelastine  0.1 % nasal spray Commonly known as: ASTELIN  Place 1 spray into both nostrils 2 (two) times daily. Use in each nostril as directed   benzonatate  200 MG capsule Commonly known as: TESSALON  Take 1 capsule (200 mg total) by mouth 3 (three) times daily as needed for cough.   chlorhexidine  0.12 % solution Commonly known as: PERIDEX  Use as directed 15 mLs in the mouth or throat 2 (two) times daily.   lisinopril -hydrochlorothiazide  20-12.5 MG tablet Commonly known as: Zestoretic  Take 1 tablet by mouth daily.   oxybutynin  5 MG tablet Commonly known as: DITROPAN  Take 5 mg by mouth daily.   oxyCODONE -acetaminophen  5-325 MG tablet Commonly known as: Percocet Take 1 tablet by mouth every 4 (four) hours as needed.    solifenacin  10 MG tablet Commonly known as: VESICARE  Take 1 tablet (10 mg total) by mouth daily.        Allergies: Allergies[1]  No family history on file.  Social History:  reports that he has been smoking cigarettes. He has never used smokeless tobacco. He reports that he does not drink alcohol and does not use drugs.  ROS: A complete review of systems was performed.  All systems are negative except for pertinent findings as noted.  Physical Exam:  Vital signs in last 24 hours: There were no vitals taken for this visit. Constitutional:  Alert and oriented, No acute distress Cardiovascular: Regular rate  Respiratory: Normal respiratory effort GI: Abdomen is soft, nontender, nondistended, no abdominal masses. No CVAT.  Genitourinary: Normal male phallus, testes are descended bilaterally and non-tender and without masses, scrotum is normal in appearance without lesions or masses, perineum is normal on inspection. Lymphatic: No lymphadenopathy Neurologic: Grossly intact, no focal deficits Psychiatric: Normal mood and affect  I have reviewed prior pt notes  I have reviewed notes from referring/previous physicians  I have reviewed urinalysis results  I have independently reviewed prior imaging  I have reviewed prior PSA results  I have reviewed prior urine culture   Impression/Assessment:  ***  Plan:  ***      [1] No Known Allergies  "

## 2024-05-05 ENCOUNTER — Ambulatory Visit: Admitting: Urology

## 2024-05-05 DIAGNOSIS — R351 Nocturia: Secondary | ICD-10-CM

## 2024-05-20 ENCOUNTER — Ambulatory Visit: Admitting: Family Medicine

## 2024-08-18 ENCOUNTER — Ambulatory Visit
